# Patient Record
Sex: Female | Born: 1965
Health system: Southern US, Community
[De-identification: ages and names within clinical notes are randomized; demographics above are authoritative.]

## PROBLEM LIST (undated history)

## (undated) DIAGNOSIS — N6452 Nipple discharge: Secondary | ICD-10-CM

## (undated) DIAGNOSIS — K219 Gastro-esophageal reflux disease without esophagitis: Secondary | ICD-10-CM

## (undated) DIAGNOSIS — E079 Disorder of thyroid, unspecified: Secondary | ICD-10-CM

## (undated) HISTORY — DX: Gastro-esophageal reflux disease without esophagitis: K21.9

## (undated) HISTORY — DX: Disorder of thyroid, unspecified: E07.9

---

## 2003-05-30 ENCOUNTER — Other Ambulatory Visit: Admission: RE | Admit: 2003-05-30 | Discharge: 2003-05-30 | Payer: Self-pay | Admitting: Obstetrics and Gynecology

## 2006-02-15 ENCOUNTER — Emergency Department (HOSPITAL_COMMUNITY): Admission: EM | Admit: 2006-02-15 | Discharge: 2006-02-16 | Payer: Self-pay | Admitting: Emergency Medicine

## 2006-09-23 ENCOUNTER — Encounter: Admission: RE | Admit: 2006-09-23 | Discharge: 2006-09-23 | Payer: Self-pay | Admitting: Family Medicine

## 2007-08-21 ENCOUNTER — Encounter: Admission: RE | Admit: 2007-08-21 | Discharge: 2007-08-21 | Payer: Self-pay | Admitting: Obstetrics and Gynecology

## 2008-03-10 ENCOUNTER — Encounter: Admission: RE | Admit: 2008-03-10 | Discharge: 2008-03-10 | Payer: Self-pay | Admitting: Gastroenterology

## 2008-08-23 ENCOUNTER — Encounter: Admission: RE | Admit: 2008-08-23 | Discharge: 2008-08-23 | Payer: Self-pay | Admitting: Obstetrics and Gynecology

## 2009-08-25 ENCOUNTER — Encounter: Admission: RE | Admit: 2009-08-25 | Discharge: 2009-08-25 | Payer: Self-pay | Admitting: Obstetrics and Gynecology

## 2009-09-05 ENCOUNTER — Encounter: Admission: RE | Admit: 2009-09-05 | Discharge: 2009-09-05 | Payer: Self-pay | Admitting: Obstetrics and Gynecology

## 2010-03-18 ENCOUNTER — Encounter: Payer: Self-pay | Admitting: Obstetrics and Gynecology

## 2010-09-05 ENCOUNTER — Other Ambulatory Visit: Payer: Self-pay | Admitting: Obstetrics and Gynecology

## 2010-09-05 DIAGNOSIS — Z1231 Encounter for screening mammogram for malignant neoplasm of breast: Secondary | ICD-10-CM

## 2010-10-04 ENCOUNTER — Ambulatory Visit: Payer: Self-pay

## 2010-10-05 ENCOUNTER — Ambulatory Visit
Admission: RE | Admit: 2010-10-05 | Discharge: 2010-10-05 | Disposition: A | Discharge status: Home / Self Care | Payer: PRIVATE HEALTH INSURANCE | Source: Ambulatory Visit | Attending: Obstetrics and Gynecology | Admitting: Obstetrics and Gynecology

## 2010-10-05 DIAGNOSIS — Z1231 Encounter for screening mammogram for malignant neoplasm of breast: Secondary | ICD-10-CM

## 2011-09-03 ENCOUNTER — Other Ambulatory Visit: Payer: Self-pay | Admitting: Obstetrics and Gynecology

## 2011-09-03 DIAGNOSIS — Z1231 Encounter for screening mammogram for malignant neoplasm of breast: Secondary | ICD-10-CM

## 2011-10-07 ENCOUNTER — Ambulatory Visit
Admission: RE | Admit: 2011-10-07 | Discharge: 2011-10-07 | Disposition: A | Discharge status: Home / Self Care | Payer: No Typology Code available for payment source | Source: Ambulatory Visit | Attending: Obstetrics and Gynecology | Admitting: Obstetrics and Gynecology

## 2011-10-07 DIAGNOSIS — Z1231 Encounter for screening mammogram for malignant neoplasm of breast: Secondary | ICD-10-CM

## 2012-09-07 ENCOUNTER — Other Ambulatory Visit: Payer: Self-pay

## 2012-09-07 DIAGNOSIS — Z1231 Encounter for screening mammogram for malignant neoplasm of breast: Secondary | ICD-10-CM

## 2012-10-08 ENCOUNTER — Ambulatory Visit
Admission: RE | Admit: 2012-10-08 | Discharge: 2012-10-08 | Disposition: A | Discharge status: Home / Self Care | Payer: BC Managed Care – PPO | Source: Ambulatory Visit

## 2012-10-08 DIAGNOSIS — Z1231 Encounter for screening mammogram for malignant neoplasm of breast: Secondary | ICD-10-CM

## 2013-09-03 ENCOUNTER — Other Ambulatory Visit: Payer: Self-pay

## 2013-09-03 DIAGNOSIS — Z1231 Encounter for screening mammogram for malignant neoplasm of breast: Secondary | ICD-10-CM

## 2013-10-11 ENCOUNTER — Ambulatory Visit
Admission: RE | Admit: 2013-10-11 | Discharge: 2013-10-11 | Disposition: A | Discharge status: Home / Self Care | Payer: BC Managed Care – PPO | Source: Ambulatory Visit

## 2013-10-11 ENCOUNTER — Encounter (INDEPENDENT_AMBULATORY_CARE_PROVIDER_SITE_OTHER): Payer: Self-pay

## 2013-10-11 DIAGNOSIS — Z1231 Encounter for screening mammogram for malignant neoplasm of breast: Secondary | ICD-10-CM

## 2014-01-05 ENCOUNTER — Telehealth: Payer: Self-pay | Admitting: Cardiovascular Disease

## 2014-01-05 NOTE — Telephone Encounter (Signed)
Received records from [facility] @ [place] ( Dr Marjory LiesBrent Burnett) for appointment with Dr Tresa EndoKelly on 01/27/14.  [facility] to [facility] Records) for Dr Landry DykeKelly's schedule on 01/27/14.  lp

## 2014-01-07 ENCOUNTER — Telehealth: Payer: Self-pay | Admitting: Cardiovascular Disease

## 2014-01-07 NOTE — Telephone Encounter (Signed)
Spoke to patient.  She states she was calling to see if her appointment can be moved to next week due to the fact she was going to [place] the following week She states she has not had any palp. for few weeks. RN reviewed schedule no available time slot. Patient aware will be placed on waitlist. Patient verbalized understanding. RN notified  Scheduler(Billie)

## 2014-01-07 NOTE — Telephone Encounter (Signed)
Going [place] on [place]24 and is supposed to wear a halter , and wants to know is this something she needs to come in earlier for or can it wait . She will be a new patient seeing Dr. Tresa EndoKelly on 01/27/14.Marland Kitchen. Please Call    Thanks

## 2014-01-27 ENCOUNTER — Ambulatory Visit (INDEPENDENT_AMBULATORY_CARE_PROVIDER_SITE_OTHER): Payer: BC Managed Care – PPO | Admitting: Cardiovascular Disease

## 2014-01-27 ENCOUNTER — Encounter: Payer: Self-pay | Admitting: Cardiovascular Disease

## 2014-01-27 VITALS — BP 142/92 | HR 68 | Ht 69.0 in | Wt 160.1 lb

## 2014-01-27 DIAGNOSIS — I10 Essential (primary) hypertension: Secondary | ICD-10-CM

## 2014-01-27 DIAGNOSIS — R002 Palpitations: Secondary | ICD-10-CM

## 2014-01-27 DIAGNOSIS — F419 Anxiety disorder, unspecified: Secondary | ICD-10-CM

## 2014-01-27 LAB — T3, FREE: T3 FREE: 2.4 pg/mL (ref 2.3–4.2)

## 2014-01-27 LAB — T4, FREE: Free T4: 1.16 ng/dL (ref 0.80–1.80)

## 2014-01-27 LAB — BASIC METABOLIC PANEL WITH GFR
BUN: 9 mg/dL (ref 6–23)
CO2: 28 mEq/L (ref 19–32)
CREATININE: 0.85 mg/dL (ref 0.50–1.10)
Calcium: 9.4 mg/dL (ref 8.4–10.5)
Chloride: 100 mEq/L (ref 96–112)
GFR, EST NON AFRICAN AMERICAN: 82 mL/min
GLUCOSE: 98 mg/dL (ref 70–99)
Potassium: 4.5 mEq/L (ref 3.5–5.3)
SODIUM: 134 meq/L — AB (ref 135–145)

## 2014-01-27 LAB — LIPID PANEL
CHOL/HDL RATIO: 2.5 ratio
Cholesterol: 159 mg/dL (ref 0–200)
HDL: 63 mg/dL (ref >39)
LDL Cholesterol: 60 mg/dL (ref 0–99)
TRIGLYCERIDES: 181 mg/dL — AB (ref <150)
VLDL: 36 mg/dL (ref 0–40)

## 2014-01-27 LAB — MAGNESIUM: Magnesium: 2 mg/dL (ref 1.5–2.5)

## 2014-01-27 MED ORDER — METOPROLOL TARTRATE 25 MG PO TABS: ORAL_TABLET | ORAL | Status: DC

## 2014-01-27 NOTE — Patient Instructions (Signed)
Your physician recommends that you schedule a follow-up appointment in: 4 WEEKS [facility]  Your physician has recommended that you wear an event monitor. Event monitors are medical devices that record the heart's electrical activity. Doctors most often us these monitors to diagnose arrhythmias. Arrhythmias are problems with the speed or rhythm of the heartbeat. The monitor is a small, portable device. You can wear one while you do your normal daily activities. This is usually used to diagnose what is causing palpitations/syncope (passing out). WEAR FOR 2 WEEKS  Your physician recommends that you HAVE LAB WORK TODAY  START METOPROLOL TART 25 MG ONE TABLET TWICE DAILY AS NEEDED FOR PALPITATIONS

## 2014-01-30 ENCOUNTER — Encounter: Payer: Self-pay | Admitting: Cardiovascular Disease

## 2014-01-30 DIAGNOSIS — I10 Essential (primary) hypertension: Secondary | ICD-10-CM | POA: Insufficient documentation

## 2014-01-30 DIAGNOSIS — R002 Palpitations: Secondary | ICD-10-CM | POA: Insufficient documentation

## 2014-01-30 DIAGNOSIS — F419 Anxiety disorder, unspecified: Secondary | ICD-10-CM | POA: Insufficient documentation

## 2014-01-30 NOTE — Progress Notes (Signed)
Patient ID: Traci Lewis, female   DOB: 1965/09/27, 48 y.o.   MRN: [id number]     PATIENT PROFILE: Traci Lewis is a 48 y.o. female who is referred through the courtesy of Dr. Marjory Lies at [facility] practice in [place] for evaluation of heart palpitations and blood pressure elevation.   HPI:  Traci Lewis denies any known coronary artery disease.  She has had mild hypothyroidism for which she has been on levothyroxine at 25 g.  She admits to not being very active.  Previously she had been drinking 2-3 cups of coffee in the morning.  She does exercise utilizing elliptical machine.  She is recently noticed that her heart rate is faster and it does not slow down following exertion.  She was evaluated on 01/03/2014, by her primary physician.  She tells me that her blood pressure was 150/100 and heart rate 1 25 bpm.  An ECGs revealed sinus tachycardia at approximately 103 bpm.  .  Patient states that proximal and 5 or 6 years ago.  She had had episodes of increased heart rate and palpitations.  She has been on citalopram 10 mg daily for anxiety.  She takes when necessary Ativan.  I was able to obtain blood work that she had done on 01/03/2014.  Prefer to level was normal at 47.5, as were iron studies.  Potassium was 4.8.  Renal function was normal with a BUN of 11 and creatinine 0.8.  Folate level was normal at 17.5.  TSH was normal at 2.47.  12 was normal at 505.  She was not anemic and her hemoglobin was 13.9 and hematocrit 43.0.  She presents now for cardiology evaluation.   Past Medical History  Diagnosis Date  . Thyroid condition circa 2013    discovered during pregnancy  . Acid reflux     History reviewed. No pertinent past surgical history.  No Known Allergies  Current Outpatient Prescriptions  Medication Sig Dispense Refill  . citalopram (CELEXA) 10 MG tablet Take 10 mg by mouth daily.  11  . JUNEL FE 1/20 1-20 MG-MCG tablet Take 1 tablet by mouth daily.  10    . levothyroxine (SYNTHROID, LEVOTHROID) 25 MCG tablet Take 1 tablet by mouth daily.  11  . LORazepam (ATIVAN) 0.5 MG tablet Take 0.5 mg by mouth daily as needed.  5  . metoprolol tartrate (LOPRESSOR) 25 MG tablet One tablet twice daily as needed for palpitations 60 tablet 11   No current facility-administered medications for this visit.    Additional social history is notable in that she is married for 23 years.  She has 3 children.  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  She never smoked.  She does drink occasional wine 1-2 days per week.  Family History  Problem Relation Age of Onset  . Arrhythmia Mother   . Heart failure Maternal Grandmother   . Arrhythmia Maternal Grandfather     ROS General: Negative; No fevers, chills, or night sweats HEENT: Negative; No changes in vision or hearing, sinus congestion, difficulty swallowing Pulmonary: Negative; No cough, wheezing, shortness of breath, hemoptysis Cardiovascular:  See HPI; No chest pain, presyncope, syncope,  edema GI: Negative; No nausea, vomiting, diarrhea, or abdominal pain GU: Negative; No dysuria, hematuria, or difficulty voiding Musculoskeletal: Negative; no myalgias, joint pain, or weakness Hematologic/Oncologic: Negative; no easy bruising, bleeding Endocrine: History of mild thyroid abnormality, currently on low-dose Synthroid.  No diabetes Neuro: Negative; no changes in  balance, headaches Skin: Negative; No rashes or skin lesions Psychiatric: Mild anxiety; No behavioral problems, depression Sleep: Negative; No daytime sleepiness, hypersomnolence, bruxism, restless legs, hypnogagnic hallucinations Other comprehensive 14 point system review is negative   Physical Exam BP 142/92 mmHg  Pulse 68  Ht 5\' 9"  (1.753 m)  Wt 160 lb 1.6 oz (72.621 kg)  BMI 23.63 kg/m2  LMP 01/27/2014 (Exact Date) General: Alert, oriented, no distress.  Skin: normal turgor, no rashes, warm and dry HEENT:  Normocephalic, atraumatic. Pupils equal round and reactive to light; sclera anicteric; extraocular muscles intact; Fundi 2 Nose without nasal septal hypertrophy Mouth/Parynx benign; Mallinpatti scale 2 Neck: No JVD, no carotid bruits; normal carotid upstroke Lungs: clear to ausculatation and percussion; no wheezing or rales Chest wall: without tenderness to palpitation Heart: PMI not displaced, RRR, s1 s2 normal, faint 1/6 systolic murmur, no diastolic murmur, no rubs, gallops, thrills, or heaves Abdomen: soft, nontender; no hepatosplenomehaly, BS+; abdominal aorta nontender and not dilated by palpation. Back: no CVA tenderness Pulses 2+ Musculoskeletal: full range of motion, normal strength, no joint deformities Extremities: no clubbing cyanosis or edema, Homan's sign negative  Neurologic: grossly nonfocal; Cranial nerves grossly wnl Psychologic: Normal mood and affect   ECG (independently read by me): Normal sinus rhythm at 68 bpm.  The PR interval is slightly decreased at 102 ms, suggesting mild accelerated AV conduction; there are no delta waves are you against WPW LABS:  BMET    Component Value Date/Time   NA 134* 01/27/2014 1405   K 4.5 01/27/2014 1405   CL 100 01/27/2014 1405   CO2 28 01/27/2014 1405   GLUCOSE 98 01/27/2014 1405   BUN 9 01/27/2014 1405   CREATININE 0.85 01/27/2014 1405   CALCIUM 9.4 01/27/2014 1405   GFRNONAA 82 01/27/2014 1405   GFRAA >89 01/27/2014 1405     Hepatic Function Panel  No results found for: PROT, ALBUMIN, AST, ALT, ALKPHOS, BILITOT, BILIDIR, IBILI   CBC No results found for: WBC, RBC, HGB, HCT, PLT, MCV, MCH, MCHC, RDW, LYMPHSABS, MONOABS, EOSABS, BASOSABS   BNP No results found for: PROBNP  Lipid Panel     Component Value Date/Time   CHOL 159 01/27/2014 1405   TRIG 181* 01/27/2014 1405   HDL 63 01/27/2014 1405   CHOLHDL 2.5 01/27/2014 1405   VLDL 36 01/27/2014 1405   LDLCALC 60 01/27/2014 1405      RADIOLOGY: No  results found.   ASSESSMENT AND PLAN: Traci Lewis is a very pleasant [age] year old female who is originally from [place].  She does not routinely exercise.  She recently has noticed her heart rate staying elevated after she exercises which may be contributed by reduced aerobic capacity.  Her ECG does suggest accelerated AV conduction.  He denies any definitive episodes of SVT or sustained tachycardia.  She was found to be hypertensive recently and blood pressure when taken by the nurse today initially was 142/92.  However, when her blood pressure was rechecked by me.  This was normal at 128/70.  I am recommending laboratory be checked consisting of a comprehensive metabolic panel, lipid studies, also will also obtain a free T3 and free T4 level.  Her recent TSH was normal.  I am scheduling her for a 2-D echo Doppler study to assess systolic and diastolic function as well as valvular architecture.  I am giving her a prescription for metoprolol tartrate to take 25 mg on an as needed basis.  I discussed the importance of increasing her  aerobic capacity and reviewed the beneficial effects of cardiovascular conditioning on her heart rate response to exercise and recovery.  I am scheduling her for 2 week event monitor to assess for potential arrhythmia.  We discussed reduction and avoidance of caffeine. I will see her back in the office in follow-up and further recommendations will be made at that time.   Lennette Biharihomas A. Kelly, MD, [facility] 01/30/2014 1:08 PM

## 2014-02-01 ENCOUNTER — Other Ambulatory Visit: Payer: Self-pay

## 2014-02-01 DIAGNOSIS — R002 Palpitations: Secondary | ICD-10-CM

## 2014-02-01 MED ORDER — METOPROLOL TARTRATE 25 MG PO TABS: ORAL_TABLET | ORAL | Status: DC

## 2014-02-04 ENCOUNTER — Encounter: Payer: Self-pay | Admitting: Cardiovascular Disease

## 2014-02-14 ENCOUNTER — Encounter: Payer: Self-pay | Admitting: Cardiovascular Disease

## 2014-03-08 ENCOUNTER — Ambulatory Visit (INDEPENDENT_AMBULATORY_CARE_PROVIDER_SITE_OTHER): Payer: BC Managed Care – PPO | Admitting: Cardiovascular Disease

## 2014-03-08 ENCOUNTER — Encounter: Payer: Self-pay | Admitting: Cardiovascular Disease

## 2014-03-08 VITALS — BP 100/62 | HR 72 | Ht 68.5 in | Wt 160.9 lb

## 2014-03-08 DIAGNOSIS — R002 Palpitations: Secondary | ICD-10-CM

## 2014-03-08 DIAGNOSIS — I10 Essential (primary) hypertension: Secondary | ICD-10-CM

## 2014-03-08 DIAGNOSIS — F419 Anxiety disorder, unspecified: Secondary | ICD-10-CM

## 2014-03-08 NOTE — Patient Instructions (Signed)
Your physician has requested that you have an echocardiogram. Echocardiography is a painless test that uses sound waves to create images of your heart. It provides your doctor with information about the size and shape of your heart and how well your heart's chambers and valves are working. This procedure takes approximately one hour. There are no restrictions for this procedure.   Your physician wants you to follow-up in:1 year or sooner if needed with Dr. Tresa EndoKelly. You will receive a reminder letter in the mail two months in advance. If you don't receive a letter, please call our office to schedule the follow-up appointment.

## 2014-03-08 NOTE — Progress Notes (Signed)
Patient ID: Traci Lewis, female   DOB: 08/19/1965, 49 y.o.   MRN: [id number]     HPI : Traci Lewis is a 49 y.o. female who was referred through the courtesy of Dr. Marjory Lies at [facility] practice in [place] for evaluation of heart palpitations and blood pressure elevation.  I saw her for initial evaluation one month ago and she presents now for follow-up assessment.   Traci Lewis denies any known coronary artery disease.  She has had mild hypothyroidism for which she has been on levothyroxine at 25 g.  She admits to not being very active.  Previously she had been drinking 2-3 cups of coffee in the morning.  She does exercise utilizing elliptical machine and recently, she noticed that her heart rate and it does not slow down following exertion.  She was evaluated on 01/03/2014, by her primary physician.  An ECGs revealed sinus tachycardia at approximately 103 bpm.  . She had had episodes of increased heart rate and palpitations.  She has been on citalopram 10 mg daily for anxiety.  She takes when necessary Ativan.  Laboratory from 01/03/2014 was reviewed. Potassium was 4.8.  Renal function was normal with a BUN of 11 and creatinine 0.8.  Folate level was normal at 17.5.  TSH was normal at 2.47.  She was not anemic and her hemoglobin was 13.9 and hematocrit 43.0.    When I saw her, I recommended that she wear a 2 week event monitor.  This was reviewed and essentially showed sinus rhythm.  There were very rare episodes of isolated PACs.  Additional laboratory including a magnesium level was normal at 2.0.  She normal free T4 1 0.16 and free T3 at 2.4.  She was supposed undergo a 2-D echo Doppler study, but apparently inadvertently this had never been scheduled.  She denies any recurrent episodes of tachycardia palpitations.  At times she notes mild anxiety spirits him around.  She is anxious to resume exercise.  She denies presyncope or syncope.  She denies chest pain.  She  presents for evaluation.   Past Medical History  Diagnosis Date  . Thyroid condition circa 2013    discovered during pregnancy  . Acid reflux     History reviewed. No pertinent past surgical history.  No Known Allergies  Current Outpatient Prescriptions  Medication Sig Dispense Refill  . citalopram (CELEXA) 10 MG tablet Take 10 mg by mouth daily.  11  . JUNEL FE 1/20 1-20 MG-MCG tablet Take 1 tablet by mouth daily.  10  . levothyroxine (SYNTHROID, LEVOTHROID) 25 MCG tablet Take 1 tablet by mouth daily.  11  . LORazepam (ATIVAN) 0.5 MG tablet Take 0.5 mg by mouth daily as needed.  5  . metoprolol tartrate (LOPRESSOR) 25 MG tablet One tablet twice daily as needed for palpitations 60 tablet 11   No current facility-administered medications for this visit.    Additional social history is notable in that she is married for 23 years.  She has 3 children.  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  She never smoked.  She does drink occasional wine 1-2 days per week.  Family History  Problem Relation Age of Onset  . Arrhythmia Mother   . Heart failure Maternal Grandmother   . Arrhythmia Maternal Grandfather     ROS General: Negative; No fevers, chills, or night sweats HEENT: Negative; No changes in vision or hearing, sinus congestion, difficulty swallowing Pulmonary: Negative;  No cough, wheezing, shortness of breath, hemoptysis Cardiovascular:  See HPI; No chest pain, presyncope, syncope,  edema GI: Negative; No nausea, vomiting, diarrhea, or abdominal pain GU: Negative; No dysuria, hematuria, or difficulty voiding Musculoskeletal: Negative; no myalgias, joint pain, or weakness Hematologic/Oncologic: Negative; no easy bruising, bleeding Endocrine: History of mild thyroid abnormality, currently on low-dose Synthroid.  No diabetes Neuro: Negative; no changes in balance, headaches Skin: Negative; No rashes or skin lesions Psychiatric: Mild anxiety; No  behavioral problems, depression Sleep: Negative; No daytime sleepiness, hypersomnolence, bruxism, restless legs, hypnogagnic hallucinations Other comprehensive 14 point system review is negative   Physical Exam BP 100/62 mmHg  Pulse 72  Ht 5' 8.5" (1.74 m)  Wt 160 lb 14.4 oz (72.984 kg)  BMI 24.11 kg/m2 General: Alert, oriented, no distress.  Skin: normal turgor, no rashes, warm and dry HEENT: Normocephalic, atraumatic. Pupils equal round and reactive to light; sclera anicteric; extraocular muscles intact; Fundi 2 Nose without nasal septal hypertrophy Mouth/Parynx benign; Mallinpatti scale 2 Neck: No JVD, no carotid bruits; normal carotid upstroke Lungs: clear to ausculatation and percussion; no wheezing or rales Chest wall: without tenderness to palpitation; mild pectus escavatum Heart: PMI not displaced, RRR, s1 s2 normal, faint 1/6 systolic murmur, no diastolic murmur, no rubs, gallops, thrills, or heaves Abdomen: soft, nontender; no hepatosplenomehaly, BS+; abdominal aorta nontender and not dilated by palpation. Back: no CVA tenderness Pulses 2+ Musculoskeletal: full range of motion, normal strength, no joint deformities Extremities: no clubbing cyanosis or edema, Homan's sign negative  Neurologic: grossly nonfocal; Cranial nerves grossly wnl Psychologic: Normal mood and affect  ECG (independently read by me): Normal sinus rhythm at 72 bpm.  PR interval is now normal at 120 ms.  QRS 74 ms.  No ectopy.  Normal QTc interval   ECG (independently read by me): Normal sinus rhythm at 68 bpm.  The PR interval is slightly decreased at 102 ms, suggesting mild accelerated AV conduction; there are no delta waves are you against WPW  LABS:  BMET    Component Value Date/Time   NA 134* 01/27/2014 1405   K 4.5 01/27/2014 1405   CL 100 01/27/2014 1405   CO2 28 01/27/2014 1405   GLUCOSE 98 01/27/2014 1405   BUN 9 01/27/2014 1405   CREATININE 0.85 01/27/2014 1405   CALCIUM 9.4  01/27/2014 1405   GFRNONAA 82 01/27/2014 1405   GFRAA >89 01/27/2014 1405     Hepatic Function Panel  No results found for: PROT, ALBUMIN, AST, ALT, ALKPHOS, BILITOT, BILIDIR, IBILI   CBC No results found for: WBC, RBC, HGB, HCT, PLT, MCV, MCH, MCHC, RDW, LYMPHSABS, MONOABS, EOSABS, BASOSABS   BNP No results found for: PROBNP  Lipid Panel     Component Value Date/Time   CHOL 159 01/27/2014 1405   TRIG 181* 01/27/2014 1405   HDL 63 01/27/2014 1405   CHOLHDL 2.5 01/27/2014 1405   VLDL 36 01/27/2014 1405   LDLCALC 60 01/27/2014 1405    RADIOLOGY: No results found.   ASSESSMENT AND PLAN: Ms. Arlyn LeakDebra Duthie is a very pleasant 49 year old female who had not routinely exercised and recently had noticed her heart rate staying elevated after she exercises which may be contributed by reduced aerobic capacity.  Her ECG does suggest accelerated AV conduction.  He denies any definitive episodes of SVT or sustained tachycardia.  When I initially saw her, I gave her prescription for metoprolol, tartrate to take 25-50 mg on an as-needed basis if she did experience an episode of  tachycardia palpitation.  I reviewed her CardioNet monitor with her in detail.  This only showed sinus rhythm with rare PACs.  These occurred approximate 5 AM.  Her subsequent electrolytes were essentially normal.  Her free T4 and free T3 levels are normal and she continues to be on low-dose Synthroid replacement.  She never had a 2-D echo Doppler study.  I have suggested that this be performed to complete her evaluation and make certain there is no structural heart disease.  On her last ECG.  There was a suggestion of mild accelerated AV conduction, without delta waves.  Her ECG today shows a normal PR interval 120 ms. .  I have recommended that she resume exercise.  We discussed the benefits of exercise training with reference to a slower heart rate at any given level left.  Submaximal exercise and with improved recovery  heart rate reduction.  She will continue to avoid caffeine in any type of pseudoephedrine preparations.  I will contact her regarding the echo Doppler study.  If she does note sustained tachycardia.  She will take when necessary metoprolol.  She will contact our office for follow-up evaluation.  Reviewed her lipid studies which were excellent with a total cholesterol 159 LDL of 60, HDL 63 and VLDL 36.  However, triglycerides were elevated.  This was obtained shortly after Thanksgiving.  It may be associated with increased desserts and sugar intake.  Following things given holiday.  We discussed reduction in carbohydrates and sweets.  Otherwise, as long as she remains stable, I will see her in one year for reevaluation.  Time spent: 25 minutes  Lennette Bihari, MD, [facility] 03/08/2014 10:16 AM

## 2014-03-15 ENCOUNTER — Ambulatory Visit (HOSPITAL_COMMUNITY)
Admission: RE | Admit: 2014-03-15 | Discharge: 2014-03-15 | Disposition: A | Discharge status: Home / Self Care | Payer: BC Managed Care – PPO | Source: Ambulatory Visit | Attending: Cardiology | Admitting: Cardiology

## 2014-03-15 DIAGNOSIS — R002 Palpitations: Secondary | ICD-10-CM | POA: Diagnosis present

## 2014-03-15 DIAGNOSIS — I10 Essential (primary) hypertension: Secondary | ICD-10-CM

## 2014-03-15 NOTE — Progress Notes (Signed)
2D Echo Performed 03/15/2014    Laquinton Bihm, RCS  

## 2014-03-20 ENCOUNTER — Encounter: Payer: Self-pay | Admitting: *Deleted

## 2015-07-17 ENCOUNTER — Other Ambulatory Visit: Payer: Self-pay

## 2015-07-17 ENCOUNTER — Encounter (HOSPITAL_COMMUNITY): Payer: Self-pay | Admitting: *Deleted

## 2015-07-17 ENCOUNTER — Emergency Department (HOSPITAL_COMMUNITY): Payer: BLUE CROSS/BLUE SHIELD

## 2015-07-17 DIAGNOSIS — R000 Tachycardia, unspecified: Secondary | ICD-10-CM | POA: Diagnosis present

## 2015-07-17 LAB — BASIC METABOLIC PANEL
ANION GAP: 8 (ref 5–15)
BUN: 15 mg/dL (ref 6–20)
CO2: 24 mmol/L (ref 22–32)
Calcium: 9.2 mg/dL (ref 8.9–10.3)
Chloride: 106 mmol/L (ref 101–111)
Creatinine, Ser: 1.04 mg/dL — ABNORMAL HIGH (ref 0.44–1.00)
GFR calc Af Amer: >60 mL/min (ref >60)
GFR calc non Af Amer: >60 mL/min (ref >60)
GLUCOSE: 107 mg/dL — AB (ref 65–99)
POTASSIUM: 4 mmol/L (ref 3.5–5.1)
Sodium: 138 mmol/L (ref 135–145)

## 2015-07-17 LAB — CBC
HEMATOCRIT: 42.4 % (ref 36.0–46.0)
Hemoglobin: 13.3 g/dL (ref 12.0–15.0)
MCH: 29 pg (ref 26.0–34.0)
MCHC: 31.4 g/dL (ref 30.0–36.0)
MCV: 92.6 fL (ref 78.0–100.0)
PLATELETS: 312 10*3/uL (ref 150–400)
RBC: 4.58 MIL/uL (ref 3.87–5.11)
RDW: 12.5 % (ref 11.5–15.5)
WBC: 6.6 10*3/uL (ref 4.0–10.5)

## 2015-07-17 LAB — TROPONIN I: Troponin I: <0.03 ng/mL (ref <0.031)

## 2015-07-17 NOTE — ED Notes (Signed)
The pt has had tachycardia since dinner time tonight  She has a history of the same  She had a cardiac work up with a holter monitor  She has a rx for lopressor but she doesn not take that regularly.  Some chest heaviness with the tachycardia.  She took ativan 1900  lmp now

## 2015-07-18 ENCOUNTER — Emergency Department (HOSPITAL_COMMUNITY)
Admission: EM | Admit: 2015-07-18 | Discharge: 2015-07-18 | Disposition: A | Discharge status: Home / Self Care | Payer: BLUE CROSS/BLUE SHIELD | Attending: Emergency Medicine | Admitting: Emergency Medicine

## 2015-07-18 NOTE — ED Notes (Signed)
Pt stated that they had kids they needed to get home to and would call to get results in the morning

## 2015-07-19 ENCOUNTER — Encounter: Payer: Self-pay | Admitting: Cardiovascular Disease

## 2015-07-19 ENCOUNTER — Ambulatory Visit (INDEPENDENT_AMBULATORY_CARE_PROVIDER_SITE_OTHER): Payer: BLUE CROSS/BLUE SHIELD | Admitting: Cardiovascular Disease

## 2015-07-19 ENCOUNTER — Other Ambulatory Visit: Payer: Self-pay | Admitting: Cardiovascular Disease

## 2015-07-19 VITALS — BP 112/68 | HR 68 | Ht 68.5 in | Wt 158.8 lb

## 2015-07-19 DIAGNOSIS — F419 Anxiety disorder, unspecified: Secondary | ICD-10-CM

## 2015-07-19 DIAGNOSIS — I10 Essential (primary) hypertension: Secondary | ICD-10-CM | POA: Diagnosis not present

## 2015-07-19 DIAGNOSIS — R9431 Abnormal electrocardiogram [ECG] [EKG]: Secondary | ICD-10-CM

## 2015-07-19 DIAGNOSIS — R002 Palpitations: Secondary | ICD-10-CM

## 2015-07-19 MED ORDER — METOPROLOL SUCCINATE ER 25 MG PO TB24: 25.0000 mg | ORAL_TABLET | Freq: Every day | ORAL | Status: DC

## 2015-07-19 MED ORDER — METOPROLOL TARTRATE 25 MG PO TABS
25.0000 mg | ORAL_TABLET | ORAL | Status: DC | PRN
Start: 2015-07-19 — End: 2015-07-21

## 2015-07-19 NOTE — Patient Instructions (Signed)
Your physician has recommended you make the following change in your medication:   1.) start the new prescription for metoprolol er succ. 25 mg @ 1/2 tablet for the first week. If tolerated then increase to 1 tablet daily.  2.) continue the lopressor as needed. For breakthrough palpitations.  Your physician recommends that you schedule a follow-up appointment in: 3 months.

## 2015-07-20 ENCOUNTER — Other Ambulatory Visit: Payer: Self-pay | Admitting: Cardiovascular Disease

## 2015-07-21 ENCOUNTER — Encounter: Payer: Self-pay | Admitting: Cardiovascular Disease

## 2015-07-21 ENCOUNTER — Telehealth: Payer: Self-pay | Admitting: Cardiovascular Disease

## 2015-07-21 ENCOUNTER — Other Ambulatory Visit: Payer: Self-pay | Admitting: *Deleted

## 2015-07-21 DIAGNOSIS — R9431 Abnormal electrocardiogram [ECG] [EKG]: Secondary | ICD-10-CM | POA: Insufficient documentation

## 2015-07-21 MED ORDER — METOPROLOL TARTRATE 25 MG PO TABS: 25.0000 mg | ORAL_TABLET | ORAL | Status: DC | PRN

## 2015-07-21 NOTE — Progress Notes (Signed)
Patient ID: Traci Lewis, female   DOB: [birth year], 50 y.o.   MRN: [id number]     HPI : Traci Lewis is a 50 y.o. female who was initially referred through the courtesy of Dr. Marjory Lies at [facility] practice in [place] for evaluation of heart palpitations and blood pressure elevation.  She presents now fora 16 month  follow-up assessment after presenting to the emergency room 2 days ago with palpitations.  Traci Lewis denies any known coronary artery disease.  She has  mild hypothyroidism for which she has been on levothyroxine at 25 g.  She admits to not being very active.  Previously she had been drinking 2-3 cups of coffee in the morning.  She does exercise utilizing elliptical machine and recently, last year she began to notice that her heart rate was not slowing down following exertion.  She was evaluated on 01/03/2014, by her primary physician.  An ECGs revealed sinus tachycardia at approximately 103 bpm.  . She had had episodes of increased heart rate and palpitations.  She has been on citalopram 10 mg daily for anxiety and has taken Ativan as needed.  Laboratory from 01/03/2014:  Potassium was 4.8.  Renal function was normal with a BUN of 11 and creatinine 0.8.  Folate level was normal at 17.5.  TSH was normal at 2.47.  She was not anemic and her hemoglobin was 13.9 and hematocrit 43.0.    When I initially saw her, I recommended that she wear a 2 week event monitor.  This essentially showed sinus rhythm.  There were very rare episodes of isolated PACs.  Additional laboratory including a magnesium level was normal at 2.0.  She normal free T4 1 0.16 and free T3 at 2.4.  She was supposed undergo a 2-D echo Doppler study, but apparently inadvertently this had never been scheduled.  Since I last saw her, she had been doing fairly well but admitted to noticing several short-lived episodes of her heart rate speeding up.  I 07/17/2015.  She was sitting at dinner and her  heart began to race.  Her pulse was proximally 123 times a minute.  After 45 minutes.  He never slowed down.  She did not take any metoprolol.  Finally, she presented to the emergency room.  Laboratory was drawn which showed a potassium of 4.0.  Creatinine was 1.04.  Glucose was 107.  She was not anemic.  Initial troponin was negative.  After waiting for approximate 5 hour.  She ultimately had to leave to take care of her children.  She was never formally evaluated by the physician.    Upon further questioning, she denies any syncope.  She denies any episodes of chest pressure.  She denies any pseudoephedrine preparations.  She typically does not drink caffeinated beverages.  However, since she has started to play tennis once a week.  She notices this increased heart rate typically after she exercises and plays tennis and may persist for up to 30 minutes. She presents to the office today for evaluation.  Past Medical History  Diagnosis Date  . Thyroid condition circa 2013    discovered during pregnancy  . Acid reflux     No past surgical history on file.  No Known Allergies  Current Outpatient Prescriptions  Medication Sig Dispense Refill  . BLISOVI 24 FE 1-20 MG-MCG(24) tablet Take 1 tablet by mouth daily.  11  . citalopram (CELEXA) 10 MG tablet Take 10 mg by mouth daily.  11  .  flurbiprofen (ANSAID) 100 MG tablet Take 100 mg by mouth 2 (two) times daily.    Marland Kitchen levothyroxine (SYNTHROID, LEVOTHROID) 25 MCG tablet Take 1 tablet by mouth daily.  11  . LORazepam (ATIVAN) 0.5 MG tablet Take 0.5 mg by mouth daily as needed.  5  . SUMAtriptan (IMITREX) 100 MG tablet Take 100 mg by mouth every 2 (two) hours as needed for migraine. May repeat in 2 hours if headache persists or recurs.    . metoprolol succinate (TOPROL XL) 25 MG 24 hr tablet Take 1 tablet (25 mg total) by mouth daily. 30 tablet 6  . metoprolol tartrate (LOPRESSOR) 25 MG tablet Take 1 tablet (25 mg total) by mouth as needed (for  breakthrough palpitations). 30 tablet 3   No current facility-administered medications for this visit.    Additional social history is notable in that she is married for 23 years.  She has 3 children.  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  She never smoked.  She does drink occasional wine 1-2 days per week.  Family History  Problem Relation Age of Onset  . Arrhythmia Mother   . Heart failure Maternal Grandmother   . Arrhythmia Maternal Grandfather     ROS General: Negative; No fevers, chills, or night sweats HEENT: Negative; No changes in vision or hearing, sinus congestion, difficulty swallowing Pulmonary: Negative; No cough, wheezing, shortness of breath, hemoptysis Cardiovascular:  See HPI;  GI: Negative; No nausea, vomiting, diarrhea, or abdominal pain GU: Negative; No dysuria, hematuria, or difficulty voiding Musculoskeletal: Negative; no myalgias, joint pain, or weakness Hematologic/Oncologic: Negative; no easy bruising, bleeding Endocrine: History of mild thyroid abnormality, currently on low-dose Synthroid.  No diabetes Neuro: Negative; no changes in balance, headaches Skin: Negative; No rashes or skin lesions Psychiatric: Mild anxiety; No behavioral problems, depression Sleep: Negative; No daytime sleepiness, hypersomnolence, bruxism, restless legs, hypnogagnic hallucinations Other comprehensive 14 point system review is negative   Physical Exam BP 112/68 mmHg  Pulse 68  Ht 5' 8.5" (1.74 m)  Wt 158 lb 12.8 oz (72.031 kg)  BMI 23.79 kg/m2  LMP 07/17/2015   Repeat blood pressure by me was 105/64 supine and was 102/64 standing.  Wt Readings from Last 3 Encounters:  07/19/15 158 lb 12.8 oz (72.031 kg)  07/17/15 160 lb (72.576 kg)  03/08/14 160 lb 14.4 oz (72.984 kg)   General: Alert, oriented, no distress.  Skin: normal turgor, no rashes, warm and dry HEENT: Normocephalic, atraumatic. Pupils equal round and reactive to light; sclera  anicteric; extraocular muscles intact; Fundi 2 Nose without nasal septal hypertrophy Mouth/Parynx benign; Mallinpatti scale 2 Neck: No JVD, no carotid bruits; normal carotid upstroke Lungs: clear to ausculatation and percussion; no wheezing or rales Chest wall: without tenderness to palpitation; mild pectus escavatum Heart: PMI not displaced, RRR; there is no ectopy on auscultation, s1 s2 normal, faint 1/6 systolic murmur, no diastolic murmur, no rubs, gallops, thrills, or heaves Abdomen: soft, nontender; no hepatosplenomehaly, BS+; abdominal aorta nontender and not dilated by palpation. Back: no CVA tenderness Pulses 2+ Musculoskeletal: full range of motion, normal strength, no joint deformities Extremities: no clubbing cyanosis or edema, Homan's sign negative  Neurologic: grossly nonfocal; Cranial nerves grossly wnl Psychologic: Normal mood and affect  ECG (independently read by me): Normal sinus rhythm at 68 bpm.  There is accelerated AV conduction with a PR interval at 96 ms.  There are no delta waves.  ECG (independently read by me): Normal sinus rhythm  at 72 bpm.  PR interval is now normal at 120 ms.  QRS 74 ms.  No ectopy.  Normal QTc interval  ECG (independently read by me): Normal sinus rhythm at 68 bpm.  The PR interval is slightly decreased at 102 ms, suggesting mild accelerated AV conduction; there are no delta waves are you against WPW  LABS:  BMP Latest Ref Rng 07/17/2015 01/27/2014  Glucose 65 - 99 mg/dL 161(W) 98  BUN 6 - 20 mg/dL 15 9  Creatinine 9.60 - 1.00 mg/dL 4.54(U) 9.81  Sodium 191 - 145 mmol/L 138 134(L)  Potassium 3.5 - 5.1 mmol/L 4.0 4.5  Chloride 101 - 111 mmol/L 106 100  CO2 22 - 32 mmol/L 24 28  Calcium 8.9 - 10.3 mg/dL 9.2 9.4    No flowsheet data found.  CBC Latest Ref Rng 07/17/2015  WBC 4.0 - 10.5 K/uL 6.6  Hemoglobin 12.0 - 15.0 g/dL 47.8  Hematocrit 29.5 - 46.0 % 42.4  Platelets 150 - 400 K/uL 312    Lab Results  Component Value Date   MCV  92.6 07/17/2015    No results found for: TSH   Lipid Panel     Component Value Date/Time   CHOL 159 01/27/2014 1405   TRIG 181* 01/27/2014 1405   HDL 63 01/27/2014 1405   CHOLHDL 2.5 01/27/2014 1405   VLDL 36 01/27/2014 1405   LDLCALC 60 01/27/2014 1405    RADIOLOGY: Dg Chest 2 View  07/17/2015  CLINICAL DATA:  Chest tightness after "tachy episode" sitting at dinner tonight. EXAM: CHEST  2 VIEW COMPARISON:  02/17/2006 FINDINGS: The cardiomediastinal contours are normal. The lungs are clear. Pulmonary vasculature is normal. No consolidation, pleural effusion, or pneumothorax. No acute osseous abnormalities are seen. IMPRESSION: No acute pulmonary process. Electronically Signed   By: Rubye Oaks M.D.   On: 07/17/2015 21:35     ASSESSMENT AND PLAN: [name] is a very pleasant 50 year old female who had not routinely exercised and In the past had noticed her heart rate staying elevated after she exercises which may be contributed by reduced aerobic capacity.  Her ECG  suggests accelerated AV conduction; there are no delta waves, arguing against WPW.  She denies any definitive episodes of SVT or sustained tachycardia.  When I initially saw her, I gave her prescription for metoprolol tartrate to take 25-50 mg on an as-needed basis if she did experience an episode of tachycardia palpitation.  A prior CardioNet monitor only showed sinus rhythm with rare PACs.  Her most recent episode from 2 days ago occurred at dinner while she was sitting up.  Her blood pressure is on the low side of normal, but she is not orthostatic.  She did not have any caffeine with dinner.  She may very well have had an episode of SVT or sinus tachycardia.  That night.  She recently has noticed her increased heart rate typically after exercise now that she is playing tennis on a weekly basis.  I am electing to start her on low-dose metoprolol succinate at 12.5 mg to take on a daily basis.  Her blood pressure is  low, which may preclude higher dosing.  However, if her blood pressure remained stable she can increase this to 25 mg.  She can also take an extra metoprolol tartrate on an as-needed basis for sustained episode.  I have recommended a follow-up TSH and magnesium level.  Her laboratory from the emergency room was reviewed.  I will see her in  3 months for reevaluation.   Time spent: 25 minutes  Lennette Biharihomas A. Kelly, MD, [facility] 07/21/2015 5:09 PM

## 2015-07-21 NOTE — Telephone Encounter (Signed)
Routing to NL

## 2015-07-21 NOTE — Telephone Encounter (Signed)
New message  Pt c/o medication issue: 1. Name of Medication: metoprolol succinate (TOPROL XL) 25 MG 24 hr tablet and metoprolol tartrate (LOPRESSOR) 25 MG tablet   4. What is your medication issue? Pt called states that she was recently prescribed metoprolol succinate (TOPROL XL) 25 MG 24 hr tablet and she reports that she is a little nervous about taking it and then she states that the other medication metoprolol tartrate (LOPRESSOR) 25 MG tablet  was denied for refills. She would like a call back to clarify why the refill was denied. Please call back to discuss

## 2015-07-21 NOTE — Telephone Encounter (Signed)
Spoke to Traci Lewis Traci Lewis states she was concerned if she should take metoprolol succinate and metoprolol tartrate Traci Lewis does not like take  medications on daily basis. She also states the metropolol tartrate was not called into the pharmacy RN  Informed Traci Lewis that Dr Tresa EndoKELLY would like for her to use metoprolol succinate on a daily basis because it last for 24 hours  and only using the tartrate on an as need basis. Traci Lewis verbalized understanding.  rn contacted pharmacy prescription called

## 2015-07-21 NOTE — Telephone Encounter (Signed)
REFILL 

## 2015-07-21 NOTE — Telephone Encounter (Signed)
Re routing this message  °

## 2015-07-21 NOTE — Telephone Encounter (Signed)
Per vm left by patient she was informed by [facility] that this rx was denied.

## 2015-09-04 ENCOUNTER — Other Ambulatory Visit: Payer: Self-pay | Admitting: Cardiovascular Disease

## 2015-09-04 MED ORDER — METOPROLOL TARTRATE 25 MG PO TABS: 25.0000 mg | ORAL_TABLET | ORAL | Status: DC | PRN

## 2015-09-04 NOTE — Telephone Encounter (Signed)
Rx request sent to pharmacy.  

## 2015-09-05 ENCOUNTER — Other Ambulatory Visit: Payer: Self-pay

## 2016-01-22 ENCOUNTER — Encounter: Payer: Self-pay | Admitting: *Deleted

## 2016-01-23 ENCOUNTER — Ambulatory Visit (INDEPENDENT_AMBULATORY_CARE_PROVIDER_SITE_OTHER): Payer: BLUE CROSS/BLUE SHIELD | Admitting: Cardiovascular Disease

## 2016-01-23 ENCOUNTER — Encounter: Payer: Self-pay | Admitting: Cardiovascular Disease

## 2016-01-23 VITALS — BP 98/70 | HR 68 | Ht 69.0 in | Wt 167.8 lb

## 2016-01-23 DIAGNOSIS — R9431 Abnormal electrocardiogram [ECG] [EKG]: Secondary | ICD-10-CM | POA: Diagnosis not present

## 2016-01-23 DIAGNOSIS — Z8669 Personal history of other diseases of the nervous system and sense organs: Secondary | ICD-10-CM

## 2016-01-23 DIAGNOSIS — I10 Essential (primary) hypertension: Secondary | ICD-10-CM | POA: Diagnosis not present

## 2016-01-23 DIAGNOSIS — Z13220 Encounter for screening for lipoid disorders: Secondary | ICD-10-CM

## 2016-01-23 DIAGNOSIS — R002 Palpitations: Secondary | ICD-10-CM

## 2016-01-23 MED ORDER — METOPROLOL SUCCINATE ER 25 MG PO TB24: 37.5000 mg | ORAL_TABLET | Freq: Every day | ORAL | 6 refills | Status: DC

## 2016-01-23 NOTE — Progress Notes (Signed)
Patient ID: Traci Lewis, female   DOB: 1966/01/21, 50 y.o.   MRN: [id number]     HPI : Traci Lewis is a 50 y.o. female who was initially referred through the courtesy of Dr. Marjory Lies at [facility] practice in [place] for evaluation of heart palpitations and blood pressure elevation.  I last saw her in May 2017.  She presents for follow-up evaluation.  Traci Lewis denies any known coronary artery disease.  She has  mild hypothyroidism for which she has been on levothyroxine at 25 g.  She admits to not being very active.  Previously she had been drinking 2-3 cups of coffee in the morning.  She does exercise utilizing elliptical machine and recently, last year she began to notice that her heart rate was not slowing down following exertion.  She was evaluated on 01/03/2014, by her primary physician.  An ECGs revealed sinus tachycardia at approximately 103 bpm.  . She had had episodes of increased heart rate and palpitations.  She has been on citalopram 10 mg daily for anxiety and has taken Ativan as needed.  Laboratory from 01/03/2014:  Potassium was 4.8.  Renal function was normal with a BUN of 11 and creatinine 0.8.  Folate level was normal at 17.5.  TSH was normal at 2.47.  She was not anemic and her hemoglobin was 13.9 and hematocrit 43.0.    When I initially saw her, I recommended that she wear a 2 week event monitor.  This essentially showed sinus rhythm.  There were very rare episodes of isolated PACs.  Additional laboratory including a magnesium level was normal at 2.0.  She normal free T4 1 0.16 and free T3 at 2.4.  She was supposed undergo a 2-D echo Doppler study, but apparently inadvertently this had never been scheduled.  Since I last saw her, she had been doing fairly well but admitted to noticing several short-lived episodes of her heart rate speeding up.  I 07/17/2015.  She was sitting at dinner and her heart began to race.  Her pulse was proximally 123 times a  minute.  After 45 minutes.  He never slowed down.  She did not take any metoprolol.  Finally, she presented to the emergency room.  Laboratory was drawn which showed a potassium of 4.0.  Creatinine was 1.04.  Glucose was 107.  She was not anemic.  Initial troponin was negative.  After waiting for approximate 5 hour.  She ultimately had to leave to take care of her children.  She was never formally evaluated by the physician.    When I last saw her, she complained of persistent tachycardia typically after she exercised was playing tennis.  At that time, I instituted Toprol-XL 25 mg to take on a daily basis and she still had metoprolol, tartrate to take on a when necessary basis.  This has helped.  However, she has continued to experience some of these episodes.  She admits that she does not routinely do aerobic exercise.  She has a history of migraine headaches and has seen Dr. Mechele Collin ligament at the headache[facility].  She has a prescription for Imitrex which she takes on an as-needed basis.  She also is currently on levothyroxine 25 g.  She takes Celexa 10 mg daily.  She was recently given.  He told of all for headache relief as an anti-inflammatory agent.  She presents for follow-up cardiology evaluation.    Past Medical History:  Diagnosis Date  .  Acid reflux   . Thyroid condition circa 2013   discovered during pregnancy    No past surgical history on file.  No Known Allergies  Current Outpatient Prescriptions  Medication Sig Dispense Refill  . BLISOVI 24 FE 1-20 MG-MCG(24) tablet Take 1 tablet by mouth daily.  11  . citalopram (CELEXA) 10 MG tablet Take 10 mg by mouth daily.  11  . etodolac (LODINE) 500 MG tablet Take 1 tablet by mouth as directed.  0  . levothyroxine (SYNTHROID, LEVOTHROID) 25 MCG tablet Take 1 tablet by mouth daily.  11  . LORazepam (ATIVAN) 0.5 MG tablet Take 0.5 mg by mouth daily as needed.  5  . metoprolol succinate (TOPROL XL) 25 MG 24 hr tablet Take 1.5  tablets (37.5 mg total) by mouth daily. 45 tablet 6  . metoprolol tartrate (LOPRESSOR) 25 MG tablet Take 1 tablet (25 mg total) by mouth as needed (for breakthrough palpitations). 90 tablet 3  . SUMAtriptan (IMITREX) 100 MG tablet Take 100 mg by mouth every 2 (two) hours as needed for migraine. May repeat in 2 hours if headache persists or recurs.     No current facility-administered medications for this visit.     Additional social history is notable in that she is married for 23 years.  She has 3 children.  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  She never smoked.  She does drink occasional wine 1-2 days per week.  Family History  Problem Relation Age of Onset  . Arrhythmia Mother   . Heart failure Maternal Grandmother   . Arrhythmia Maternal Grandfather     ROS General: Negative; No fevers, chills, or night sweats HEENT: Negative; No changes in vision or hearing, sinus congestion, difficulty swallowing Pulmonary: Negative; No cough, wheezing, shortness of breath, hemoptysis Cardiovascular:  See HPI;  GI: Negative; No nausea, vomiting, diarrhea, or abdominal pain GU: Negative; No dysuria, hematuria, or difficulty voiding Musculoskeletal: Negative; no myalgias, joint pain, or weakness Hematologic/Oncologic: Negative; no easy bruising, bleeding Endocrine: History of mild thyroid abnormality, currently on low-dose Synthroid.  No diabetes Neuro: Negative; no changes in balance, headaches Skin: Negative; No rashes or skin lesions Psychiatric: Mild anxiety; No behavioral problems, depression Sleep: Negative; No daytime sleepiness, hypersomnolence, bruxism, restless legs, hypnogagnic hallucinations Other comprehensive 14 point system review is negative   Physical Exam BP 98/70   Pulse 68   Ht 5\' 9"  (1.753 m)   Wt 167 lb 12.8 oz (76.1 kg)   BMI 24.78 kg/m    Repeat blood pressure by me was 105/64 supine and was 102/64 standing.  Wt Readings from Last  3 Encounters:  01/23/16 167 lb 12.8 oz (76.1 kg)  07/19/15 158 lb 12.8 oz (72 kg)  07/17/15 160 lb (72.6 kg)   General: Alert, oriented, no distress.  Skin: normal turgor, no rashes, warm and dry HEENT: Normocephalic, atraumatic. Pupils equal round and reactive to light; sclera anicteric; extraocular muscles intact; Fundi 2 Nose without nasal septal hypertrophy Mouth/Parynx benign; Mallinpatti scale 2 Neck: No JVD, no carotid bruits; normal carotid upstroke Lungs: clear to ausculatation and percussion; no wheezing or rales Chest wall: without tenderness to palpitation; mild pectus escavatum Heart: PMI not displaced, RRR; there is no ectopy on auscultation, s1 s2 normal, faint 1/6 systolic murmur, no diastolic murmur, no rubs, gallops, thrills, or heaves Abdomen: soft, nontender; no hepatosplenomehaly, BS+; abdominal aorta nontender and not dilated by palpation. Back: no CVA tenderness Pulses 2+ Musculoskeletal: full range  of motion, normal strength, no joint deformities Extremities: no clubbing cyanosis or edema, Homan's sign negative  Neurologic: grossly nonfocal; Cranial nerves grossly wnl Psychologic: Normal mood and affect  ECG (independently read by me): Sinus rhythm.  Short PR interval at 106 ms.  QTc interval normal.  May 2017 ECG (independently read by me): Normal sinus rhythm at 68 bpm.  There is accelerated AV conduction with a PR interval at 96 ms.  There are no delta waves.  ECG (independently read by me): Normal sinus rhythm at 72 bpm.  PR interval is now normal at 120 ms.  QRS 74 ms.  No ectopy.  Normal QTc interval  ECG (independently read by me): Normal sinus rhythm at 68 bpm.  The PR interval is slightly decreased at 102 ms, suggesting mild accelerated AV conduction; there are no delta waves are you against WPW  LABS:  BMP Latest Ref Rng & Units 07/17/2015 01/27/2014  Glucose 65 - 99 mg/dL 536(U107(H) 98  BUN 6 - 20 mg/dL 15 9  Creatinine 4.400.44 - 1.00 mg/dL 3.47(Q1.04(H) 2.590.85    Sodium 135 - 145 mmol/L 138 134(L)  Potassium 3.5 - 5.1 mmol/L 4.0 4.5  Chloride 101 - 111 mmol/L 106 100  CO2 22 - 32 mmol/L 24 28  Calcium 8.9 - 10.3 mg/dL 9.2 9.4    No flowsheet data found.  CBC Latest Ref Rng & Units 07/17/2015  WBC 4.0 - 10.5 K/uL 6.6  Hemoglobin 12.0 - 15.0 g/dL 56.313.3  Hematocrit 87.536.0 - 46.0 % 42.4  Platelets 150 - 400 K/uL 312    Lab Results  Component Value Date   MCV 92.6 07/17/2015    No results found for: TSH   Lipid Panel     Component Value Date/Time   CHOL 159 01/27/2014 1405   TRIG 181 (H) 01/27/2014 1405   HDL 63 01/27/2014 1405   CHOLHDL 2.5 01/27/2014 1405   VLDL 36 01/27/2014 1405   LDLCALC 60 01/27/2014 1405    RADIOLOGY: No results found.   ASSESSMENT AND PLAN: Ms. Arlyn LeakDebra Mcgurn is a very pleasant 50 year old female who had not routinely exercised and In the past had noticed her heart rate staying elevated after she exercised which may be contributed by reduced aerobic capacity.  Her ECG  suggests accelerated AV conduction; there are no delta waves, arguing against WPW.  She denies any definitive episodes of SVT or sustained tachycardia.  When I initially saw her, I gave her prescription for metoprolol tartrate to take 25-50 mg on an as-needed basis if she did experience an episode of tachycardia palpitation.  A prior CardioNet monitor only showed sinus rhythm with rare PACs.  When I last saw her, due to continued persistent elevated heart rates after exercise I started her on metoprolol succinate initially at 12.5 mg with titration up to 25 mg.  This has improved her symptoms.  However, she still notes that at times if she has to run fast heart rate seems to abruptly increase.  She denies associated presyncope or syncope.  She denies any sensation of chest pressure.  I have suggested further titration of her metoprolol succinate to 37.5 mg daily.  She had noted some fatigue since initiating treatment and for this reason have not titrated  her to 50 mg but discuss with her if recurrent symptomatology occurs this may be necessary.  I again specifically went over the heart rate response associated with SVT versus sinus tachycardia.  She denies any difficulty with sleep.  She  denies any daytime sleepiness.  She denies nocturnal palpitations.  We discussed avoidance of caffeine, which she has still been taking.  I am recommending a complete set of fasting laboratory including thyroid function studies, a magnesium level.  Remotely, her triglycerides were elevated, and lipid studies will be done.  I encouraged her to at least to some type of aerobic activity 4-5 days per week to improve aerobic capacity.  She will contact our office if she developed develops recurrent episodes of tachycardia.  If she remains stable I will see her in 6 months for follow-up evaluation.  Time spent: 25 minutes  Lennette Biharihomas A. Lamerle Jabs, MD, [facility] 01/23/2016 12:44 PM

## 2016-01-23 NOTE — Patient Instructions (Signed)
Your physician has recommended you make the following change in your medication:   1.) the metoprolol succ has been increased to 37.5 mg daily (1.5 of the 25 mg tablet.)  Your physician recommends that you return for lab work in: FASTING  Your physician wants you to follow-up in: 6 months or sooner if needed. You will receive a reminder letter in the mail two months in advance. If you don't receive a letter, please call our office to schedule the follow-up appointment.  If you need a refill on your cardiac medications before your next appointment, please call your pharmacy.

## 2016-05-21 LAB — CBC
HEMATOCRIT: 40 % (ref 35.0–45.0)
Hemoglobin: 13.1 g/dL (ref 11.7–15.5)
MCH: 30 pg (ref 27.0–33.0)
MCHC: 32.8 g/dL (ref 32.0–36.0)
MCV: 91.7 fL (ref 80.0–100.0)
MPV: 10.2 fL (ref 7.5–12.5)
PLATELETS: 276 10*3/uL (ref 140–400)
RBC: 4.36 MIL/uL (ref 3.80–5.10)
RDW: 12.8 % (ref 11.0–15.0)
WBC: 5.9 10*3/uL (ref 3.8–10.8)

## 2016-05-21 LAB — COMPREHENSIVE METABOLIC PANEL
ALT: 11 U/L (ref 6–29)
AST: 13 U/L (ref 10–35)
Albumin: 3.8 g/dL (ref 3.6–5.1)
Alkaline Phosphatase: 26 U/L — ABNORMAL LOW (ref 33–115)
BUN: 17 mg/dL (ref 7–25)
CALCIUM: 8.8 mg/dL (ref 8.6–10.2)
CO2: 27 mmol/L (ref 20–31)
Chloride: 104 mmol/L (ref 98–110)
Creat: 0.96 mg/dL (ref 0.50–1.10)
GLUCOSE: 83 mg/dL (ref 65–99)
POTASSIUM: 4.5 mmol/L (ref 3.5–5.3)
Sodium: 137 mmol/L (ref 135–146)
Total Bilirubin: 0.6 mg/dL (ref 0.2–1.2)
Total Protein: 6.8 g/dL (ref 6.1–8.1)

## 2016-05-21 LAB — LIPID PANEL
CHOL/HDL RATIO: 3.2 ratio (ref <5.0)
Cholesterol: 169 mg/dL (ref <200)
HDL: 53 mg/dL (ref >50)
LDL CALC: 86 mg/dL (ref <100)
TRIGLYCERIDES: 149 mg/dL (ref <150)
VLDL: 30 mg/dL (ref <30)

## 2016-05-21 LAB — MAGNESIUM: Magnesium: 2.1 mg/dL (ref 1.5–2.5)

## 2016-05-21 LAB — TSH: TSH: 3.87 mIU/L

## 2016-06-03 ENCOUNTER — Other Ambulatory Visit: Payer: Self-pay | Admitting: Family Medicine

## 2016-06-03 DIAGNOSIS — R1012 Left upper quadrant pain: Secondary | ICD-10-CM

## 2016-06-05 ENCOUNTER — Ambulatory Visit
Admission: RE | Admit: 2016-06-05 | Discharge: 2016-06-05 | Disposition: A | Discharge status: Home / Self Care | Payer: BLUE CROSS/BLUE SHIELD | Source: Ambulatory Visit | Attending: Family Medicine | Admitting: Family Medicine

## 2016-06-05 ENCOUNTER — Other Ambulatory Visit: Payer: BLUE CROSS/BLUE SHIELD

## 2016-06-05 DIAGNOSIS — R1012 Left upper quadrant pain: Secondary | ICD-10-CM

## 2016-07-04 ENCOUNTER — Other Ambulatory Visit: Payer: Self-pay | Admitting: *Deleted

## 2016-07-04 MED ORDER — METOPROLOL SUCCINATE ER 25 MG PO TB24: 37.5000 mg | ORAL_TABLET | Freq: Every day | ORAL | Status: DC

## 2016-07-04 MED ORDER — METOPROLOL TARTRATE 25 MG PO TABS: 25.0000 mg | ORAL_TABLET | ORAL | 3 refills | Status: DC | PRN

## 2016-09-04 ENCOUNTER — Other Ambulatory Visit: Payer: Self-pay | Admitting: Cardiovascular Disease

## 2017-01-22 ENCOUNTER — Other Ambulatory Visit: Payer: Self-pay | Admitting: *Deleted

## 2017-01-22 MED ORDER — METOPROLOL SUCCINATE ER 25 MG PO TB24: 37.5000 mg | ORAL_TABLET | Freq: Every day | ORAL | 0 refills | Status: DC

## 2017-03-04 ENCOUNTER — Other Ambulatory Visit: Payer: Self-pay | Admitting: Obstetrics and Gynecology

## 2017-03-04 DIAGNOSIS — N6452 Nipple discharge: Secondary | ICD-10-CM

## 2017-03-07 ENCOUNTER — Other Ambulatory Visit: Payer: BLUE CROSS/BLUE SHIELD

## 2017-03-14 ENCOUNTER — Ambulatory Visit
Admission: RE | Admit: 2017-03-14 | Discharge: 2017-03-14 | Disposition: A | Discharge status: Home / Self Care | Payer: BLUE CROSS/BLUE SHIELD | Source: Ambulatory Visit | Attending: Obstetrics and Gynecology | Admitting: Obstetrics and Gynecology

## 2017-03-14 DIAGNOSIS — N6452 Nipple discharge: Secondary | ICD-10-CM

## 2017-03-14 HISTORY — DX: Nipple discharge: N64.52

## 2017-03-19 ENCOUNTER — Other Ambulatory Visit: Payer: Self-pay | Admitting: Surgery

## 2017-03-19 DIAGNOSIS — N6452 Nipple discharge: Secondary | ICD-10-CM

## 2017-04-01 ENCOUNTER — Ambulatory Visit
Admission: RE | Admit: 2017-04-01 | Discharge: 2017-04-01 | Disposition: A | Discharge status: Home / Self Care | Payer: BLUE CROSS/BLUE SHIELD | Source: Ambulatory Visit | Attending: Surgery | Admitting: Surgery

## 2017-04-01 DIAGNOSIS — N6452 Nipple discharge: Secondary | ICD-10-CM

## 2017-04-01 MED ORDER — GADOBENATE DIMEGLUMINE 529 MG/ML IV SOLN
13.0000 mL | Freq: Once | INTRAVENOUS | Status: AC | PRN
  Administered 2017-04-01: 13 mL via INTRAVENOUS

## 2017-04-08 ENCOUNTER — Other Ambulatory Visit: Payer: Self-pay | Admitting: Surgery

## 2017-04-08 DIAGNOSIS — N6452 Nipple discharge: Secondary | ICD-10-CM

## 2017-04-15 ENCOUNTER — Ambulatory Visit
Admission: RE | Admit: 2017-04-15 | Discharge: 2017-04-15 | Disposition: A | Discharge status: Home / Self Care | Payer: BLUE CROSS/BLUE SHIELD | Source: Ambulatory Visit | Attending: Surgery | Admitting: Surgery

## 2017-04-15 ENCOUNTER — Other Ambulatory Visit: Payer: Self-pay | Admitting: Surgery

## 2017-04-15 DIAGNOSIS — N6452 Nipple discharge: Secondary | ICD-10-CM

## 2017-04-15 MED ORDER — GADOBENATE DIMEGLUMINE 529 MG/ML IV SOLN: 15.0000 mL | Freq: Once | INTRAVENOUS | Status: DC | PRN

## 2017-04-17 ENCOUNTER — Other Ambulatory Visit: Payer: Self-pay | Admitting: Surgery

## 2017-04-18 ENCOUNTER — Other Ambulatory Visit: Payer: Self-pay | Admitting: Cardiovascular Disease

## 2017-04-18 NOTE — Telephone Encounter (Signed)
REFILL 

## 2017-05-15 ENCOUNTER — Other Ambulatory Visit: Payer: Self-pay | Admitting: Cardiovascular Disease

## 2017-05-19 ENCOUNTER — Other Ambulatory Visit: Payer: Self-pay

## 2017-05-19 MED ORDER — LORAZEPAM 0.5 MG PO TABS: 0.5000 mg | ORAL_TABLET | Freq: Every day | ORAL | 5 refills | Status: AC | PRN

## 2017-05-21 ENCOUNTER — Other Ambulatory Visit: Payer: Self-pay | Admitting: *Deleted

## 2017-06-04 ENCOUNTER — Other Ambulatory Visit: Payer: Self-pay | Admitting: Cardiovascular Disease

## 2017-06-04 MED ORDER — METOPROLOL SUCCINATE ER 25 MG PO TB24: 37.5000 mg | ORAL_TABLET | Freq: Every day | ORAL | 0 refills | Status: DC

## 2017-06-04 NOTE — Telephone Encounter (Signed)
°*  STAT* If patient is at the pharmacy, call can be transferred to refill team.   1. Which medications need to be refilled? (please list name of each medication and dose if known) Metoprolol  2. Which pharmacy/location (including street and city if local pharmacy) is medication to be sent to?[facility] [phone] 3. Do they need a 30 day or 90 day supply? 135 and refills

## 2017-07-21 NOTE — Progress Notes (Signed)
Cardiology Office Note   Date:  07/22/2017   ID:  Traci Lewis, DOB 11-16-1965, MRN [id number]   PCP:  Richmond Campbell., PA-C  Cardiologist:  Dr. Tresa Endo  Chief Complaint  Patient presents with  . Follow-up  . Palpitations     History of Present Illness: Traci Lewis is a 52 y.o. female who presents for ongoing assessment and management of coronary artery disease, with history of tachycardia with frequent PACs, history of hypothyroidism.  The patient was placed on metoprolol XL 25 mg to take on a daily basis and additional metoprolol tartrate for breakthrough tachycardia which mostly occurs after exercise such as playing tennis.  The patient was last seen in the office on 01/23/2016 by Dr. Tresa Endo and was to follow-up with him in 6 months unless symptoms persisted or worsened.   She continues to have rare episodes of tachycardia. Most recent when she was outside in the heat working in her garden. She is here for refills. She has not had any labs completed in over a year concerning BMET, CBC or Thyroid studies.   Past Medical History:  Diagnosis Date  . Acid reflux   . Breast discharge    left clear/yellowish spont  . Thyroid condition circa 2013   discovered during pregnancy    No past surgical history on file.   Current Outpatient Medications  Medication Sig Dispense Refill  . BLISOVI 24 FE 1-20 MG-MCG(24) tablet Take 1 tablet by mouth daily.  11  . citalopram (CELEXA) 10 MG tablet Take 10 mg by mouth daily.  11  . etodolac (LODINE) 500 MG tablet Take 1 tablet by mouth as directed.  0  . flurbiprofen (ANSAID) 100 MG tablet Take 100 mg by mouth 2 (two) times daily as needed.    Marland Kitchen levothyroxine (SYNTHROID, LEVOTHROID) 25 MCG tablet Take 1 tablet by mouth daily.  11  . LORazepam (ATIVAN) 0.5 MG tablet Take 1 tablet (0.5 mg total) by mouth daily as needed. 30 tablet 5  . metoprolol succinate (TOPROL-XL) 25 MG 24 hr tablet Take 1.5 tablets (37.5 mg total) by mouth daily. 135  tablet 3  . metoprolol tartrate (LOPRESSOR) 25 MG tablet Take 1 tablet (25 mg total) by mouth as needed (for breakthrough palpitations). 90 tablet 3  . SUMAtriptan (IMITREX) 100 MG tablet Take 100 mg by mouth every 2 (two) hours as needed for migraine. May repeat in 2 hours if headache persists or recurs.     No current facility-administered medications for this visit.     Allergies:   Patient has no known allergies.  Social History:  The patient  reports that she has never smoked. She has never used smokeless tobacco. She reports that she drinks about 0.6 oz of alcohol per week. She reports that she does not use drugs.   Family History:  The patient's family history includes Arrhythmia in her maternal grandfather and mother; Breast cancer in her maternal aunt and sister; Heart failure in her maternal grandmother.    ROS: All other systems are reviewed and negative. Unless otherwise mentioned in H&P    PHYSICAL EXAM: VS:  BP 112/68   Pulse 66   Ht  (1.753 m)   Wt 167 lb 9.6 oz (76 kg)   BMI 24.75 kg/m  , BMI Body mass index is 24.75 kg/m. GEN: Well nourished, well developed, in no acute distress  HEENT: normal  Neck: no JVD, carotid bruits, or masses Cardiac: RRR; no murmurs, rubs, or gallops,no  edema  Respiratory:  clear to auscultation bilaterally, normal work of breathing GI: soft, nontender, nondistended, + BS MS: no deformity or atrophy  Skin: warm and dry, no rash Neuro:  Strength and sensation are intact Psych: euthymic mood, full affect   EKG:  NSR with short PR interval. Rate of 66 bpm/.   Recent Labs: No results found for requested labs within last 8760 hours.    Lipid Panel    Component Value Date/Time   CHOL 169 05/21/2016 0809   TRIG 149 05/21/2016 0809   HDL 53 05/21/2016 0809   CHOLHDL 3.2 05/21/2016 0809   VLDL 30 05/21/2016 0809   LDLCALC 86 05/21/2016 0809      Wt Readings from Last 3 Encounters:  07/22/17 167 lb 9.6 oz (76 kg)  01/23/16  167 lb 12.8 oz (76.1 kg)  07/19/15 158 lb 12.8 oz (72 kg)      Other studies Reviewed:  Echocardiogram 03/15/2014  Left ventricle: The cavity size was normal. Wall thickness was normal. Systolic function was normal. The estimated ejection fraction was in the range of 55% to 60%. Wall motion was normal; there were no regional wall motion abnormalities. Left ventricular diastolic function parameters were normal. - Mitral valve: Trivial prolapse, involving the anterior leaflet.  ASSESSMENT AND PLAN:  1. Tachy-palpitations: She is having rare episodes of racing heart rate Most recent a few days ago when she was working outside in her garden. She drank some water and rested, heart rate returned to normal.  I will refill metoprolol succinate and tartrate. She will have labs completed to include magnesium, TSH, CBC and BMET.  2. Hypothyroidism: She has not seen PCP in a while. I have advised her to follow up and discuss PCP needs and refills on thyroid medications. Labs will be forwarded to her.    Current medicines are reviewed at length with the patient today.    Labs/ tests ordered today include: Mg, CBC, BMET, TSH   Bettey Mare. Liborio Nixon, ANP, AACC   07/22/2017 11:11 AM    [facility] [address], [place], [place] [address] Phone: [phone]; Fax: [phone]

## 2017-07-22 ENCOUNTER — Encounter: Payer: Self-pay | Admitting: Adult Health

## 2017-07-22 ENCOUNTER — Ambulatory Visit: Payer: BLUE CROSS/BLUE SHIELD | Admitting: Adult Health

## 2017-07-22 VITALS — BP 112/68 | HR 66 | Ht 69.0 in | Wt 167.6 lb

## 2017-07-22 DIAGNOSIS — I10 Essential (primary) hypertension: Secondary | ICD-10-CM

## 2017-07-22 DIAGNOSIS — R002 Palpitations: Secondary | ICD-10-CM | POA: Diagnosis not present

## 2017-07-22 DIAGNOSIS — Z79899 Other long term (current) drug therapy: Secondary | ICD-10-CM

## 2017-07-22 LAB — BASIC METABOLIC PANEL
BUN/Creatinine Ratio: 16 (ref 9–23)
BUN: 13 mg/dL (ref 6–24)
CO2: 19 mmol/L — ABNORMAL LOW (ref 20–29)
CREATININE: 0.83 mg/dL (ref 0.57–1.00)
Calcium: 9.3 mg/dL (ref 8.7–10.2)
Chloride: 104 mmol/L (ref 96–106)
GFR calc Af Amer: 95 mL/min/{1.73_m2} (ref >59)
GFR, EST NON AFRICAN AMERICAN: 82 mL/min/{1.73_m2} (ref >59)
GLUCOSE: 77 mg/dL (ref 65–99)
Potassium: 4.9 mmol/L (ref 3.5–5.2)
SODIUM: 137 mmol/L (ref 134–144)

## 2017-07-22 LAB — TSH: TSH: 2.79 u[IU]/mL (ref 0.450–4.500)

## 2017-07-22 LAB — MAGNESIUM: MAGNESIUM: 2 mg/dL (ref 1.6–2.3)

## 2017-07-22 LAB — CBC
HEMATOCRIT: 39.3 % (ref 34.0–46.6)
HEMOGLOBIN: 13.2 g/dL (ref 11.1–15.9)
MCH: 30.6 pg (ref 26.6–33.0)
MCHC: 33.6 g/dL (ref 31.5–35.7)
MCV: 91 fL (ref 79–97)
Platelets: 311 10*3/uL (ref 150–450)
RBC: 4.31 x10E6/uL (ref 3.77–5.28)
RDW: 12.5 % (ref 12.3–15.4)
WBC: 6.6 10*3/uL (ref 3.4–10.8)

## 2017-07-22 MED ORDER — METOPROLOL TARTRATE 25 MG PO TABS: 25.0000 mg | ORAL_TABLET | ORAL | 3 refills | Status: DC | PRN

## 2017-07-22 MED ORDER — METOPROLOL SUCCINATE ER 25 MG PO TB24: 37.5000 mg | ORAL_TABLET | Freq: Every day | ORAL | 3 refills | Status: DC

## 2017-07-22 NOTE — Patient Instructions (Signed)
Medication Instructions:  NO CHANGES- Your physician recommends that you continue on your current medications as directed. Please refer to the Current Medication list given to you today.  If you need a refill on your cardiac medications before your next appointment, please call your pharmacy.  Special Instructions: PLEASE STAY HYDRATED IN THIS HOT WEATHER  WE WILL CALL YOU WITH LAB RESULTS, PLEASE CALL WITH ANY ISSUES  Follow-Up: Your physician wants you to follow-up in: 12 MONTHS WITH DR Tresa Endo You should receive a reminder letter in the mail two months in advance. If you do not receive a letter, please call our office MARCH 2020 to schedule the MAY 2020 follow-up appointment.   Thank you for choosing [facility] at [facility]!!

## 2017-11-22 ENCOUNTER — Telehealth: Payer: Self-pay | Admitting: Cardiology

## 2017-11-22 NOTE — Telephone Encounter (Signed)
Pt called after hours number with complaints of fast heart beat when she woke this am at 6:30, she didn't count rate but felt her neck and was fast. She went back to sleep and was still fast when she woke at 8:30 so she took a prn metoprolol tartrate 25 mg. She called because her heart rate was in the low 100's. She felt weak, slightly dizzy when her HR was up. I advised her to go to the ED and she really doesn't want to go. While I talked to her I had her check her BP, 104/81, which she says is her norm. At the time HR is 80 on the BP monitor and also on her FitBit. In discussion she will take it easy today and can take another dose of metoprolol if HR goes back up. Also talked her through valsalva maneuver. I advised her that if her HR goes up again and she cannot control it, or if she is symptomatic she needs to go to the ED.   Berton Bon, AGNP-C [facility] 11/22/2017  9:59 AM

## 2017-11-24 NOTE — Telephone Encounter (Signed)
Agree with recommendations.  

## 2018-03-14 IMAGING — US US ABDOMEN COMPLETE
1 series · 14 of 25 positions shown · non-contrast
Comparison: None in PACs

CLINICAL DATA: Left upper quadrant abdominal pain for the past
month

EXAM:
ABDOMEN ULTRASOUND COMPLETE

[Series 1: us abdomen complete · 0.17mm/px · 14 of 85 slices shown]
[im 1/85]
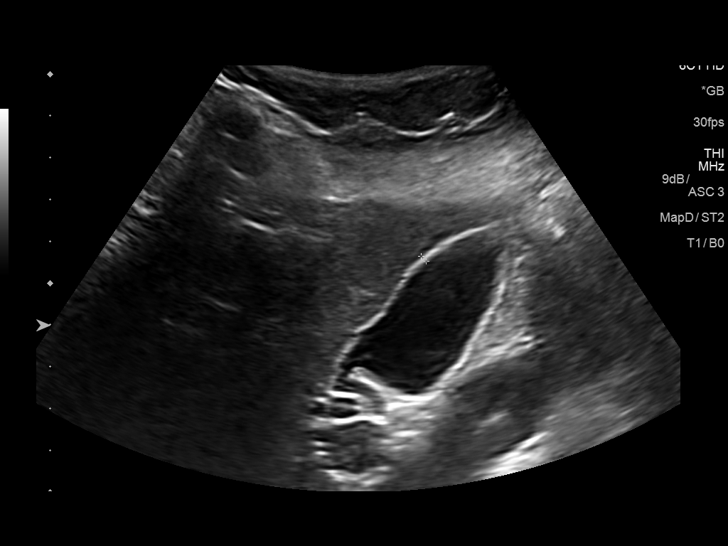
[im 8/85]
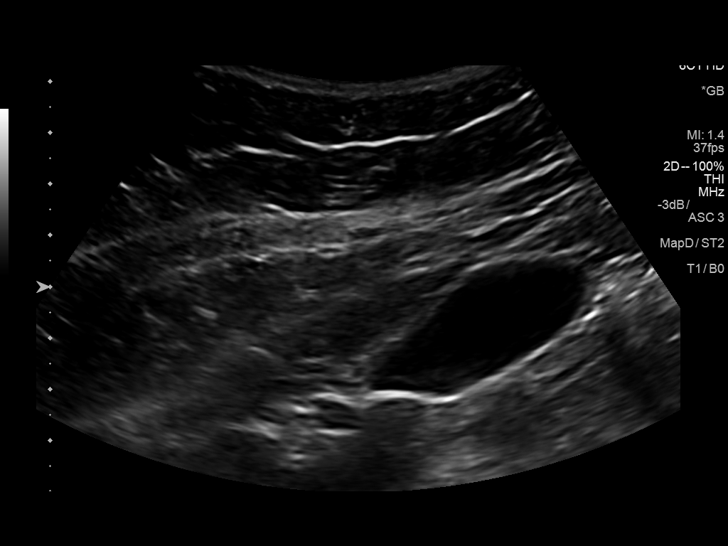
[im 15/85]
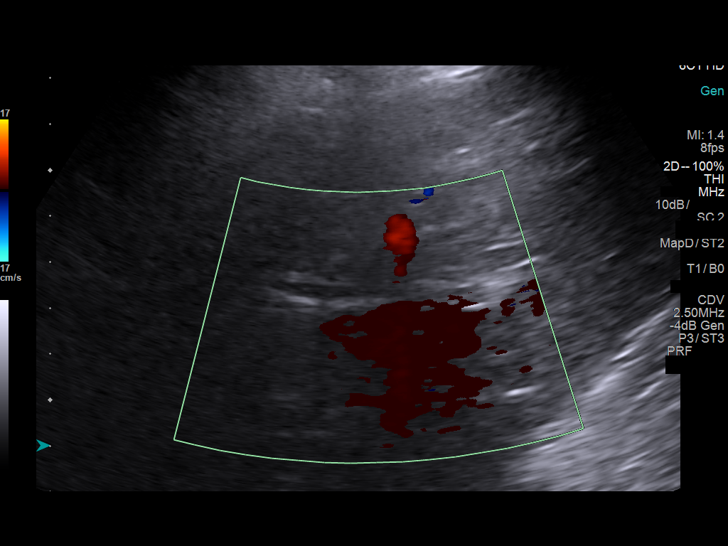
[im 22/85]
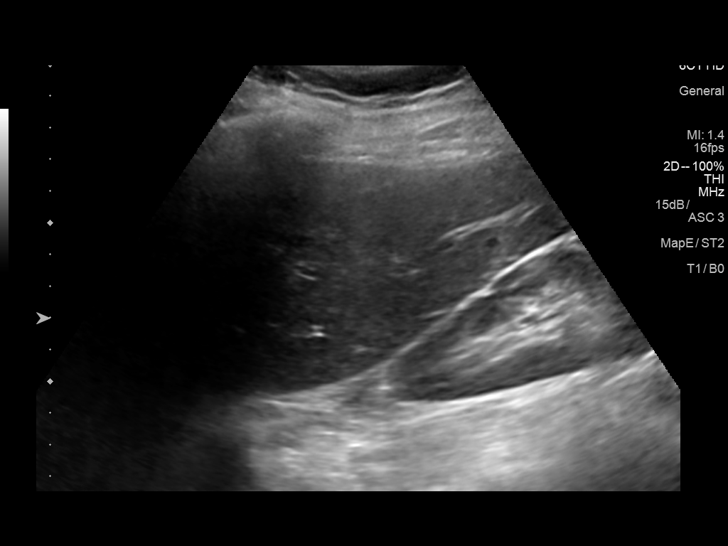
[im 29/85]
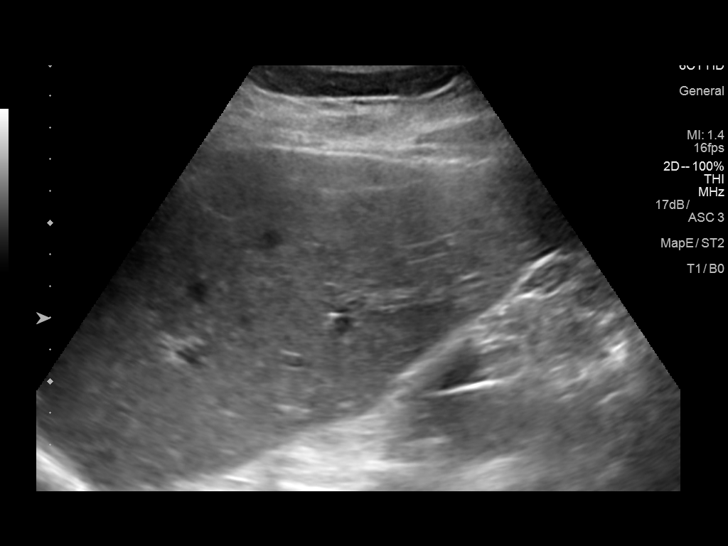
[im 32/85]
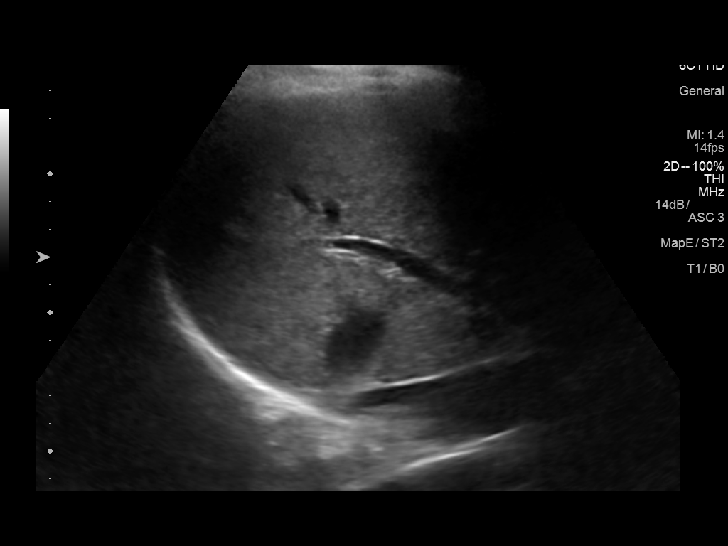
[im 39/85]
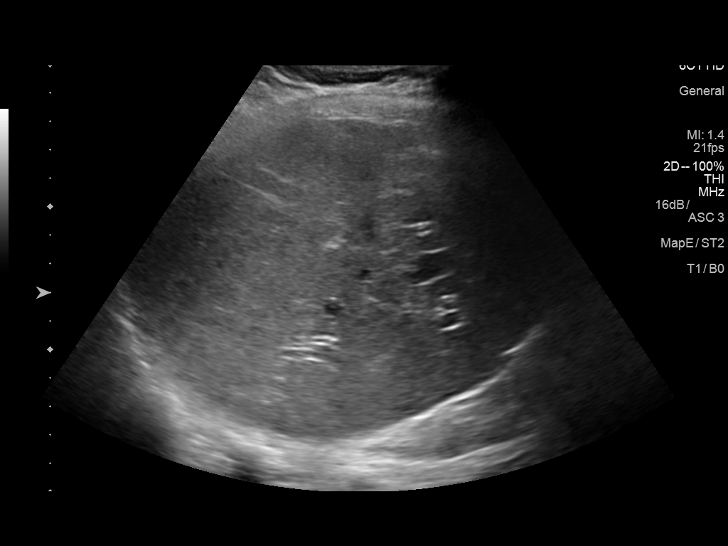
[im 46/85]
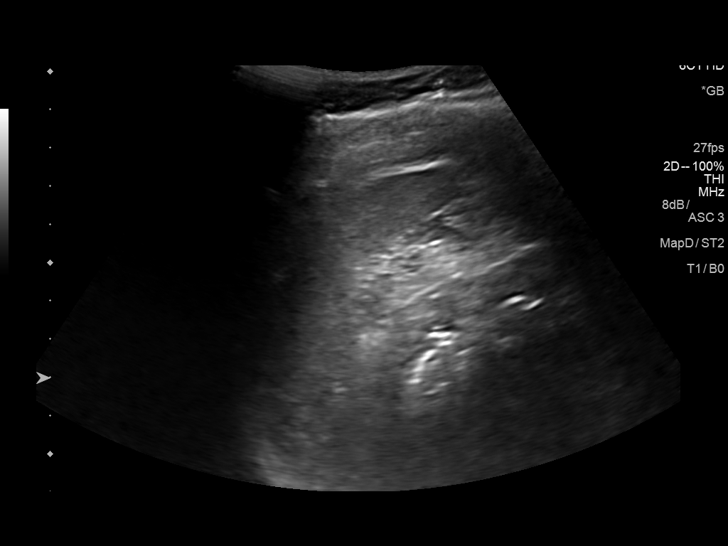
[im 53/85]
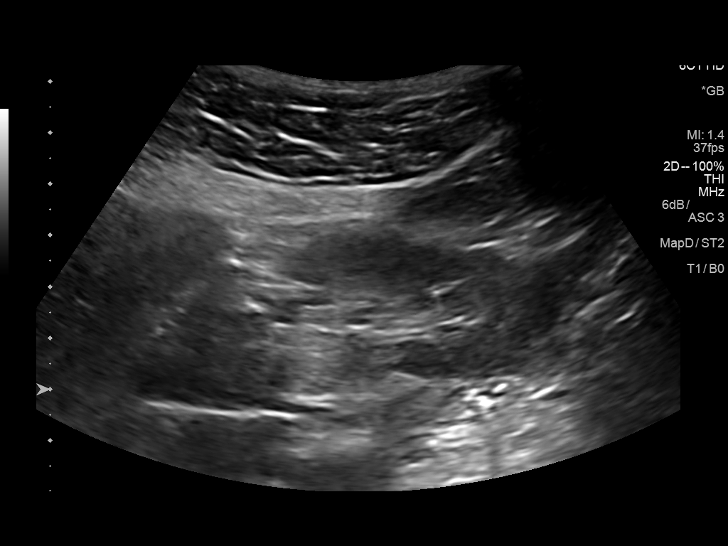
[im 57/85]
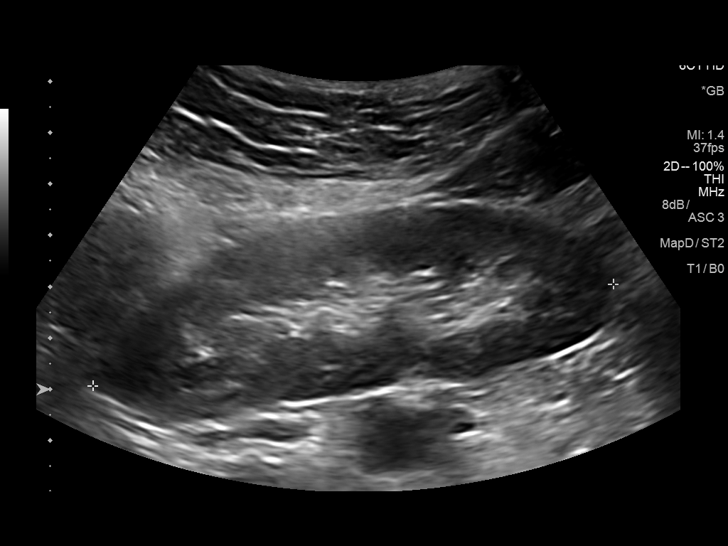
[im 64/85]
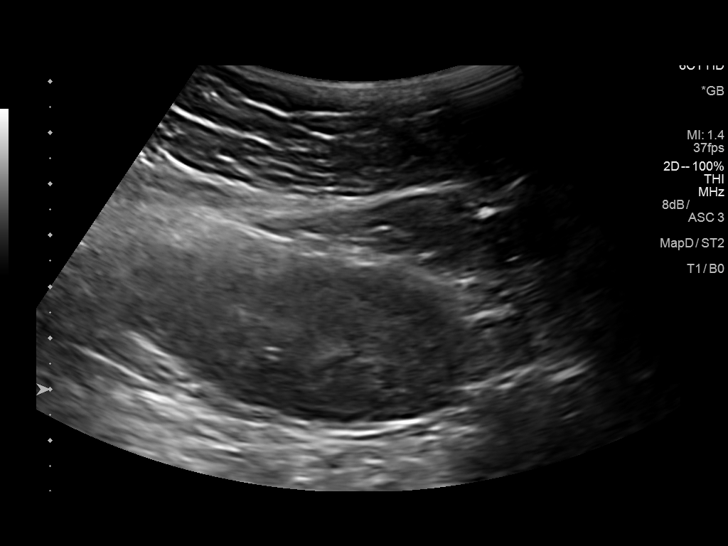
[im 71/85]
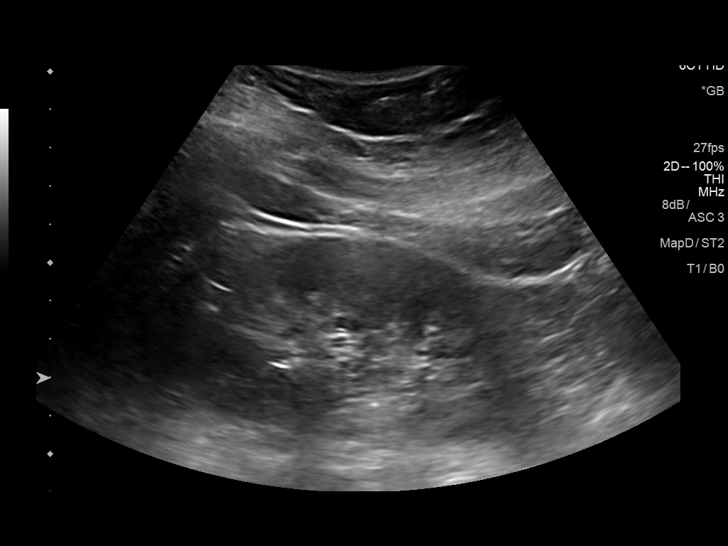
[im 78/85]
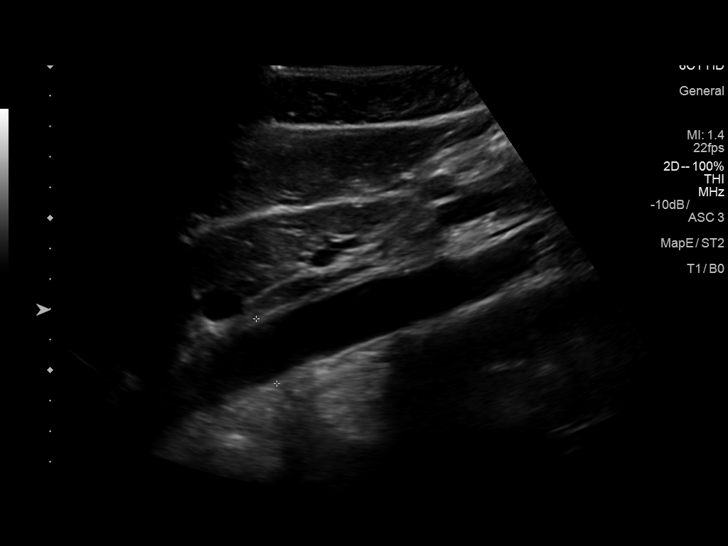
[im 85/85]
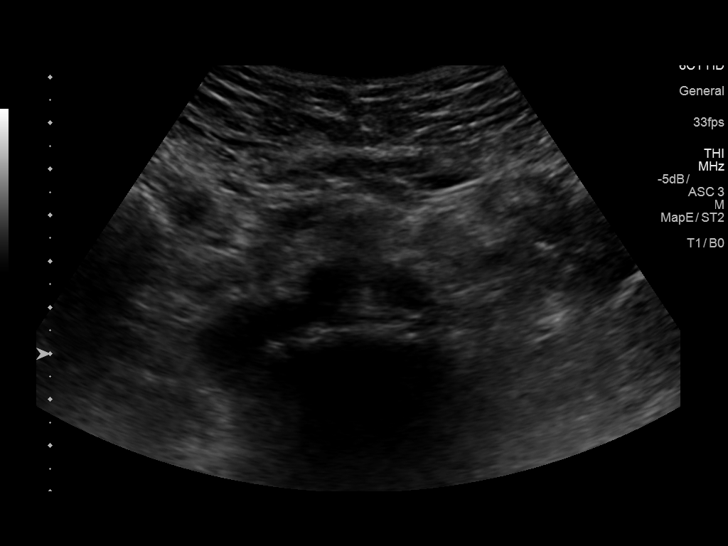

[14 of 25 positions shown; findings below may reference images not displayed]

FINDINGS: Gallbladder: No gallstones or wall thickening visualized. No
sonographic Murphy sign noted by sonographer.

Common bile duct: Diameter: 3.4 mm

Liver: No focal lesion identified. Within normal limits in
parenchymal echogenicity.

IVC: No abnormality visualized.

Pancreas: The pancreas was largely obscured by bowel gas.

Spleen: Size and appearance within normal limits.

Right Kidney: Length: 10.3 cm. Echogenicity within normal limits. No
mass or hydronephrosis visualized.

Left Kidney: Length: 10.6 cm. Echogenicity within normal limits. No
mass or hydronephrosis visualized.

Abdominal aorta: No aneurysm visualized.

Other findings: There is no ascites.
IMPRESSION: Normal appearance of the liver and spleen.  Normal gallbladder.

The pancreas was obscured by bowel gas.

If the patient's symptoms persist and remain unexplained, abdominal
and pelvic CT scanning would be a useful next imaging step.

## 2018-09-15 ENCOUNTER — Other Ambulatory Visit: Payer: Self-pay

## 2018-09-15 MED ORDER — METOPROLOL TARTRATE 25 MG PO TABS: 25.0000 mg | ORAL_TABLET | ORAL | 1 refills | Status: DC | PRN

## 2018-09-17 ENCOUNTER — Other Ambulatory Visit: Payer: Self-pay | Admitting: Cardiovascular Disease

## 2018-10-21 ENCOUNTER — Telehealth: Payer: Self-pay | Admitting: *Deleted

## 2018-10-21 ENCOUNTER — Other Ambulatory Visit: Payer: Self-pay

## 2018-10-21 ENCOUNTER — Ambulatory Visit: Payer: BC Managed Care – PPO | Admitting: Cardiovascular Disease

## 2018-10-21 ENCOUNTER — Encounter: Payer: Self-pay | Admitting: Cardiovascular Disease

## 2018-10-21 VITALS — BP 102/63 | HR 60 | Ht 69.0 in | Wt 176.0 lb

## 2018-10-21 DIAGNOSIS — R9431 Abnormal electrocardiogram [ECG] [EKG]: Secondary | ICD-10-CM | POA: Diagnosis not present

## 2018-10-21 DIAGNOSIS — Z8669 Personal history of other diseases of the nervous system and sense organs: Secondary | ICD-10-CM

## 2018-10-21 DIAGNOSIS — I10 Essential (primary) hypertension: Secondary | ICD-10-CM

## 2018-10-21 DIAGNOSIS — F419 Anxiety disorder, unspecified: Secondary | ICD-10-CM

## 2018-10-21 DIAGNOSIS — R002 Palpitations: Secondary | ICD-10-CM | POA: Diagnosis not present

## 2018-10-21 LAB — COMPREHENSIVE METABOLIC PANEL
ALT: 9 IU/L (ref 0–32)
AST: 12 IU/L (ref 0–40)
Albumin/Globulin Ratio: 1.8 (ref 1.2–2.2)
Albumin: 4.3 g/dL (ref 3.8–4.9)
Alkaline Phosphatase: 28 IU/L — ABNORMAL LOW (ref 39–117)
BUN/Creatinine Ratio: 16 (ref 9–23)
BUN: 15 mg/dL (ref 6–24)
Bilirubin Total: 0.4 mg/dL (ref 0.0–1.2)
CO2: 20 mmol/L (ref 20–29)
Calcium: 9.2 mg/dL (ref 8.7–10.2)
Chloride: 103 mmol/L (ref 96–106)
Creatinine, Ser: 0.94 mg/dL (ref 0.57–1.00)
GFR calc Af Amer: 81 mL/min/{1.73_m2} (ref >59)
GFR calc non Af Amer: 70 mL/min/{1.73_m2} (ref >59)
Globulin, Total: 2.4 g/dL (ref 1.5–4.5)
Glucose: 79 mg/dL (ref 65–99)
Potassium: 4.8 mmol/L (ref 3.5–5.2)
Sodium: 138 mmol/L (ref 134–144)
Total Protein: 6.7 g/dL (ref 6.0–8.5)

## 2018-10-21 LAB — CBC
Hematocrit: 41.4 % (ref 34.0–46.6)
Hemoglobin: 13.8 g/dL (ref 11.1–15.9)
MCH: 30.7 pg (ref 26.6–33.0)
MCHC: 33.3 g/dL (ref 31.5–35.7)
MCV: 92 fL (ref 79–97)
Platelets: 324 10*3/uL (ref 150–450)
RBC: 4.5 x10E6/uL (ref 3.77–5.28)
RDW: 11.9 % (ref 11.7–15.4)
WBC: 7.1 10*3/uL (ref 3.4–10.8)

## 2018-10-21 LAB — TSH: TSH: 3 u[IU]/mL (ref 0.450–4.500)

## 2018-10-21 LAB — LIPID PANEL
Chol/HDL Ratio: 3.6 ratio (ref 0.0–4.4)
Cholesterol, Total: 191 mg/dL (ref 100–199)
HDL: 53 mg/dL (ref >39)
LDL Calculated: 92 mg/dL (ref 0–99)
Triglycerides: 229 mg/dL — ABNORMAL HIGH (ref 0–149)
VLDL Cholesterol Cal: 46 mg/dL — ABNORMAL HIGH (ref 5–40)

## 2018-10-21 LAB — MAGNESIUM: Magnesium: 2.1 mg/dL (ref 1.6–2.3)

## 2018-10-21 MED ORDER — METOPROLOL SUCCINATE ER 25 MG PO TB24: ORAL_TABLET | ORAL | 0 refills | Status: DC

## 2018-10-21 NOTE — Telephone Encounter (Signed)
[employer] will ship a 30 day cardiac event monitor to the patients home.  Instructions reviewed briefly as they are included in the monitor kit.

## 2018-10-21 NOTE — Patient Instructions (Addendum)
Medication Instructions:  Change how you take Metoprolol- take 1 tablet (25 mg) in the AM, and 1 tablet (25 mg) in the PM If you need a refill on your cardiac medications before your next appointment, please call your pharmacy.   Lab work: Fasting lab work (CBC, CMET, TSH, LIPID, MAG) today   If you have labs (blood work) drawn today and your tests are completely normal, you will receive your results only by: Marland Kitchen MyChart Message (if you have MyChart) OR . A paper copy in the mail If you have any lab test that is abnormal or we need to change your treatment, we will call you to review the results.  Testing/Procedures: Echocardiogram - Your physician has requested that you have an echocardiogram. Echocardiography is a painless test that uses sound waves to create images of your heart. It provides your doctor with information about the size and shape of your heart and how well your heart's chambers and valves are working. This procedure takes approximately one hour. There are no restrictions for this procedure. This will be performed at our [facility] location - [address].  Your physician has recommended that you wear an event monitor-30 day. Event monitors are medical devices that record the heart's electrical activity. Doctors most often [place] these monitors to diagnose arrhythmias. Arrhythmias are problems with the speed or rhythm of the heartbeat. The monitor is a small, portable device. You can wear one while you do your normal daily activities. This is usually used to diagnose what is causing palpitations/syncope (passing out). This will be performed at our [facility] location - [address].    Follow-Up: At [facility], you and your health needs are our priority.  As part of our continuing mission to provide you with exceptional heart care, we have created designated Provider Care Teams.  These Care Teams include your primary Cardiologist (physician) and  Advanced Practice Providers (APPs -  Physician Assistants and Nurse Practitioners) who all work together to provide you with the care you need, when you need it. You will need a follow up appointment in 2 months.You may see Dr.Kelly or one of the following Advanced Practice Providers on your designated Care Team: Almyra Deforest, [place] . Fabian Sharp, PA-C

## 2018-10-21 NOTE — Progress Notes (Signed)
Patient ID: [name], female   DOB: 11-May-1965, 53 y.o.   MRN: [id number]     HPI : BETHANEE REDONDO is a 53 y.o. female who was initially referred through the courtesy of Dr. Juanita Craver at [facility] practice in [place] for evaluation of heart palpitations and blood pressure elevation.  I last saw her in May 2017.  She presents for follow-up evaluation.  [name] denies any known coronary artery disease.  She has  mild hypothyroidism for which she has been on levothyroxine at 25 g.  She admits to not being very active.  Previously she had been drinking 2-3 cups of coffee in the morning.  She does exercise utilizing elliptical machine and recently, last year she began to notice that her heart rate was not slowing down following exertion.  She was evaluated on 01/03/2014, by her primary physician.  An ECGs revealed sinus tachycardia at approximately 103 bpm.  . She had had episodes of increased heart rate and palpitations.  She has been on citalopram 10 mg daily for anxiety and has taken Ativan as needed.  Laboratory from 01/03/2014:  Potassium was 4.8.  Renal function was normal with a BUN of 11 and creatinine 0.8.  Folate level was normal at 17.5.  TSH was normal at 2.47.  She was not anemic and her hemoglobin was 13.9 and hematocrit 43.0.    When I initially saw her, I recommended that she wear a 2 week event monitor.  This essentially showed sinus rhythm.  There were very rare episodes of isolated PACs.  Additional laboratory including a magnesium level was normal at 2.0.  She normal free T4 1 0.16 and free T3 at 2.4.  She was supposed undergo a 2-D echo Doppler study, but apparently inadvertently this had never been scheduled.  She had been doing fairly well but admitted to noticing several short-lived episodes of her heart rate speeding up.  I 07/17/2015.  She was sitting at dinner and her heart began to race.  Her pulse was proximally 123 times a minute.  After 45  minutes.  He never slowed down.  She did not take any metoprolol.  Finally, she presented to the emergency room.  Laboratory was drawn which showed a potassium of 4.0.  Creatinine was 1.04.  Glucose was 107.  She was not anemic.  Initial troponin was negative.  After waiting for approximate 5 hour.  She ultimately had to leave to take care of her children.  She was never formally evaluated by the physician.    When I saw her in May 2017 she complained of persistent tachycardia typically after she exercised was playing tennis.  At that time, I instituted Toprol-XL 25 mg to take on a daily basis and she still had metoprolol, tartrate to take on a when necessary basis.  This has helped.  However, she has continued to experience some of these episodes.  She admitted that she does not routinely do aerobic exercise.  She has a history of migraine headaches and has seen Dr. Vira Agar ligament at the headache, neck pain clinic.  She has a prescription for Imitrex which she takes on an as-needed basis.  She also is currently on levothyroxine 25 g.  She takes Celexa 10 mg daily.  She was recently given.  He told of all for headache relief as an anti-inflammatory agent.    I last saw her in follow-up in November 2017 she felt improved on metoprolol succinate which had  been titrated up to 25 mg.  In the past several years, she admits to having 3 episodes this past summer during periods of significant heat when she was walking where she noticed a heart rate speeding up.  One episode her heart rate stayed elevated for 1 hour.  Leaves her heart rate had increased to 140 bpm.  She denied associated chest pain presyncope or syncope.  She currently has been drinking 1/2 cup to 1 cup of caffeine per day.  She presents for evaluation.  Past Medical History:  Diagnosis Date   Acid reflux    Breast discharge    left clear/yellowish spont   Thyroid condition circa 2013   discovered during pregnancy    No past surgical  history on file.  No Known Allergies  Current Outpatient Medications  Medication Sig Dispense Refill   BLISOVI 24 FE 1-20 MG-MCG(24) tablet Take 1 tablet by mouth daily.  11   citalopram (CELEXA) 10 MG tablet Take 10 mg by mouth daily.  11   flurbiprofen (ANSAID) 100 MG tablet Take 100 mg by mouth 2 (two) times daily as needed.     levothyroxine (SYNTHROID, LEVOTHROID) 25 MCG tablet Take 1 tablet by mouth daily.  11   LORazepam (ATIVAN) 0.5 MG tablet Take 1 tablet (0.5 mg total) by mouth daily as needed. 30 tablet 5   metoprolol succinate (TOPROL-XL) 25 MG 24 hr tablet Take 1 tablet (25 mg) in the morning, and 1 tablet (25 mg) in the evening at bedtime. 135 tablet 0   metoprolol tartrate (LOPRESSOR) 25 MG tablet Take 1 tablet (25 mg total) by mouth as needed (for breakthrough palpitations). 90 tablet 1   SUMAtriptan (IMITREX) 100 MG tablet Take 100 mg by mouth every 2 (two) hours as needed for migraine. May repeat in 2 hours if headache persists or recurs.     No current facility-administered medications for this visit.     Additional social history is notable in that she is married for 23 years.  She has 3 children.  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  She never smoked.  She does drink occasional wine 1-2 days per week.  Family History  Problem Relation Age of Onset   Arrhythmia Mother    Breast cancer Sister    Heart failure Maternal Grandmother    Arrhythmia Maternal Grandfather    Breast cancer Maternal Aunt     ROS General: Negative; No fevers, chills, or night sweats HEENT: Negative; No changes in vision or hearing, sinus congestion, difficulty swallowing Pulmonary: Negative; No cough, wheezing, shortness of breath, hemoptysis Cardiovascular:  See HPI;  GI: Negative; No nausea, vomiting, diarrhea, or abdominal pain GU: Negative; No dysuria, hematuria, or difficulty voiding Musculoskeletal: Negative; no myalgias, joint pain, or  weakness Hematologic/Oncologic: Negative; no easy bruising, bleeding Endocrine: History of mild thyroid abnormality, currently on low-dose Synthroid.  No diabetes Neuro: Negative; no changes in balance, headaches Skin: Negative; No rashes or skin lesions Psychiatric: Mild anxiety; No behavioral problems, depression Sleep: Negative; No daytime sleepiness, hypersomnolence, bruxism, restless legs, hypnogagnic hallucinations Other comprehensive 14 point system review is negative   Physical Exam BP 102/63    Pulse 60    Ht _0  (1.753 m)    Wt 176 lb (79.8 kg)    SpO2 99%    BMI 25.99 kg/m    Repeat blood pressure by me was 102/62 supine and 100/60 standing.  Wt Readings from Last 3 Encounters:  10/21/18 176 lb (79.8 kg)  07/22/17 167 lb 9.6 oz (76 kg)  01/23/16 167 lb 12.8 oz (76.1 kg)     Physical Exam BP 102/63    Pulse 60    Ht _0  (1.753 m)    Wt 176 lb (79.8 kg)    SpO2 99%    BMI 25.99 kg/m  General: Alert, oriented, no distress.  Skin: normal turgor, no rashes, warm and dry HEENT: Normocephalic, atraumatic. Pupils equal round and reactive to light; sclera anicteric; extraocular muscles intact;  Nose without nasal septal hypertrophy Mouth/Parynx benign; Mallinpatti scale 2 Neck: No JVD, no carotid bruits; normal carotid upstroke Lungs: clear to ausculatation and percussion; no wheezing or rales Chest wall: without tenderness to palpitation Heart: PMI not displaced, RRR, s1 s2 normal, 1/6 systolic murmur, no diastolic murmur, no rubs, gallops, thrills, or heaves Abdomen: soft, nontender; no hepatosplenomehaly, BS+; abdominal aorta nontender and not dilated by palpation. Back: no CVA tenderness Pulses 2+ Musculoskeletal: full range of motion, normal strength, no joint deformities Extremities: no clubbing cyanosis or edema, Homan's sign negative  Neurologic: grossly nonfocal; Cranial nerves grossly wnl Psychologic: Normal mood and affect   ECG (independently read by me):  Sinus rhythm; short PR at 98 msec without delta wave  November 2017 ECG (independently read by me): Sinus rhythm.  Short PR interval at 106 ms.  QTc interval normal.  May 2017 ECG (independently read by me): Normal sinus rhythm at 68 bpm.  There is accelerated AV conduction with a PR interval at 96 ms.  There are no delta waves.  ECG (independently read by me): Normal sinus rhythm at 72 bpm.  PR interval is now normal at 120 ms.  QRS 74 ms.  No ectopy.  Normal QTc interval  ECG (independently read by me): Normal sinus rhythm at 68 bpm.  The PR interval is slightly decreased at 102 ms, suggesting mild accelerated AV conduction; there are no delta waves are you against WPW  LABS:  BMP Latest Ref Rng & Units 10/21/2018 07/22/2017 05/21/2016  Glucose 65 - 99 mg/dL 79 77 83  BUN 6 - 24 mg/dL _1 Creatinine 0.57 - 1.00 mg/dL 0.94 0.83 0.96  BUN/Creat Ratio 9 - _2 -  Sodium 134 - 144 mmol/L 138 137 137  Potassium 3.5 - 5.2 mmol/L 4.8 4.9 4.5  Chloride 96 - 106 mmol/L 103 104 104  CO2 20 - 29 mmol/L 20 19(L) 27  Calcium 8.7 - 10.2 mg/dL 9.2 9.3 8.8    Hepatic Function Latest Ref Rng & Units 10/21/2018 05/21/2016  Total Protein 6.0 - 8.5 g/dL 6.7 6.8  Albumin 3.8 - 4.9 g/dL 4.3 3.8  AST 0 - 40 IU/L 12 13  ALT 0 - 32 IU/L 9 11  Alk Phosphatase 39 - 117 IU/L 28(L) 26(L)  Total Bilirubin 0.0 - 1.2 mg/dL 0.4 0.6    CBC Latest Ref Rng & Units 10/21/2018 07/22/2017 05/21/2016  WBC 3.4 - 10.8 x10E3/uL 7.1 6.6 5.9  Hemoglobin 11.1 - 15.9 g/dL 13.8 13.2 13.1  Hematocrit 34.0 - 46.6 % 41.4 39.3 40.0  Platelets 150 - 450 x10E3/uL 324 311 276    Lab Results  Component Value Date   MCV 92 10/21/2018   MCV 91 07/22/2017   MCV 91.7 05/21/2016    Lab Results  Component Value Date   TSH 3.000 10/21/2018     Lipid Panel     Component Value Date/Time   CHOL 191 10/21/2018 1028  TRIG 229 (H) 10/21/2018 1028   HDL 53 10/21/2018 1028   CHOLHDL 3.6 10/21/2018 1028   CHOLHDL 3.2  05/21/2016 0809   VLDL 30 05/21/2016 0809   LDLCALC 92 10/21/2018 1028    RADIOLOGY: No results found.  IMPRESSION: 1. Essential hypertension   2. Palpitations   3. Shortened PR interval   4. Anxiety   5. Hx of migraine headaches     ASSESSMENT AND PLAN: Ms. Hayzlee Mcsorley is a very pleasant 53 year old female who had not routinely exercised and In the past had noticed her heart rate staying elevated after she exercised which may be contributed by reduced aerobic capacity.  Her ECGs have suggested accelerated AV conduction; there are no delta waves, arguing against WPW.  She denies any definitive episodes of SVT or sustained tachycardia.  When I initially saw her, I gave her prescription for metoprolol tartrate to take 25-50 mg on an as-needed basis if she did experience an episode of tachycardia palpitation.  A prior CardioNet monitor only showed sinus rhythm with rare PACs.   Due to continued persistent elevated heart rates after exercise I started her on metoprolol succinate initially at 12.5 mg with titration up to 25 mg.  This has improved her symptoms.  However, she still notes that at times if she has to run fast heart rate seems to abruptly increase.  She denies associated presyncope or syncope.  She denies any sensation of chest pressure.  I have suggested further titration of her metoprolol succinate to 37.5 mg daily.  She had noted some fatigue since initiating treatment and for this reason had not titrated her to 50 mg but discuss with her if recurrent symptomatology occurs this may be necessary.  Since I last saw her in 2017 she admits to 3 episodes this summer when she experienced prolonged increased heart rate typically while she was walking on hot days and typically always when she was standing up.  No episode was associated with presyncope or syncope or chest pain.  Again discussed avoidance of caffeine.  I am scheduling her for a follow-up echo Doppler study for reassessment of LV  function and chamber dimension.  I am checking laboratory today consisting of a chemistry profile CBC magnesium level TSH level in addition to lipid studies.  I have suggested she increase her morning metoprolol to 25 mg and continue to take 12.5 mg at night.  I will have her wear a 30 day cardiac monitor for reassessment.  She has a prescription for citalopram for which she has taken for anxiety.  She also takes Imitrex as needed for migraines.  I will see her in 2 months in follow-up of the above studies and further recommendations were made at that time.  Time spent: 25 minutes  Troy Sine, MD, [facility] 10/27/2018 6:44 PM

## 2018-10-27 ENCOUNTER — Encounter: Payer: Self-pay | Admitting: Cardiovascular Disease

## 2018-11-03 ENCOUNTER — Other Ambulatory Visit: Payer: Self-pay

## 2018-11-03 ENCOUNTER — Ambulatory Visit (HOSPITAL_COMMUNITY): Payer: No Typology Code available for payment source | Attending: Cardiovascular Disease

## 2018-11-03 DIAGNOSIS — R002 Palpitations: Secondary | ICD-10-CM | POA: Insufficient documentation

## 2018-11-03 DIAGNOSIS — I10 Essential (primary) hypertension: Secondary | ICD-10-CM | POA: Diagnosis present

## 2018-11-04 ENCOUNTER — Ambulatory Visit (INDEPENDENT_AMBULATORY_CARE_PROVIDER_SITE_OTHER): Payer: No Typology Code available for payment source

## 2018-11-04 DIAGNOSIS — R002 Palpitations: Secondary | ICD-10-CM

## 2018-11-04 DIAGNOSIS — I10 Essential (primary) hypertension: Secondary | ICD-10-CM | POA: Diagnosis not present

## 2018-11-20 ENCOUNTER — Encounter: Payer: Self-pay | Admitting: Cardiovascular Disease

## 2018-11-20 NOTE — Telephone Encounter (Signed)
error 

## 2019-01-12 ENCOUNTER — Ambulatory Visit: Payer: No Typology Code available for payment source | Admitting: Cardiovascular Disease

## 2019-01-12 ENCOUNTER — Other Ambulatory Visit: Payer: Self-pay

## 2019-01-12 ENCOUNTER — Encounter: Payer: Self-pay | Admitting: Cardiovascular Disease

## 2019-01-12 DIAGNOSIS — R002 Palpitations: Secondary | ICD-10-CM | POA: Diagnosis not present

## 2019-01-12 DIAGNOSIS — F419 Anxiety disorder, unspecified: Secondary | ICD-10-CM

## 2019-01-12 DIAGNOSIS — Z8669 Personal history of other diseases of the nervous system and sense organs: Secondary | ICD-10-CM

## 2019-01-12 DIAGNOSIS — R9431 Abnormal electrocardiogram [ECG] [EKG]: Secondary | ICD-10-CM

## 2019-01-12 DIAGNOSIS — I10 Essential (primary) hypertension: Secondary | ICD-10-CM | POA: Diagnosis not present

## 2019-01-12 MED ORDER — METOPROLOL SUCCINATE ER 25 MG PO TB24: ORAL_TABLET | ORAL | 4 refills | Status: DC

## 2019-01-12 NOTE — Patient Instructions (Signed)
Medication Instructions:  CHANGE METOPROLOL 25MG  IN THE AM AND 1/2 TAB IN THE PM If you need a refill on your cardiac medications before your next appointment, please call your pharmacy.  Follow-Up: AS NEEDED  In Person Shelva Majestic, MD.    At [facility], you and your health needs are our priority.  As part of our continuing mission to provide you with exceptional heart care, we have created designated Provider Care Teams.  These Care Teams include your primary Cardiologist (physician) and Advanced Practice Providers (APPs -  Physician Assistants and Nurse Practitioners) who all work together to provide you with the care you need, when you need it.  Thank you for choosing [facility] at [facility]!!

## 2019-01-12 NOTE — Progress Notes (Signed)
Patient ID: Traci Lewis, female   DOB: [birth year], 53 y.o.   MRN: [id number]     HPI : Traci Lewis is a 53 y.o. female who was initially referred through the courtesy of Dr. Juanita Craver at [facility] practice in [place] for evaluation of heart palpitations and blood pressure elevation.  After not having seen her since May 2017 I saw her in August 2020 with complaints of increased heart rate.  She presents for follow-up evaluation  Traci Lewis denies any known coronary artery disease.  She has  mild hypothyroidism for which she has been on levothyroxine at 25 g.  She admits to not being very active.  Previously she had been drinking 2-3 cups of coffee in the morning.  She does exercise utilizing elliptical machine and recently, last year she began to notice that her heart rate was not slowing down following exertion.  She was evaluated on 01/03/2014, by her primary physician.  An ECGs revealed sinus tachycardia at approximately 103 bpm.  . She had had episodes of increased heart rate and palpitations.  She has been on citalopram 10 mg daily for anxiety and has taken Ativan as needed.  Laboratory from 01/03/2014:  Potassium was 4.8.  Renal function was normal with a BUN of 11 and creatinine 0.8.  Folate level was normal at 17.5.  TSH was normal at 2.47.  She was not anemic and her hemoglobin was 13.9 and hematocrit 43.0.    When I initially saw her, I recommended that she wear a 2 week event monitor.  This essentially showed sinus rhythm.  There were very rare episodes of isolated PACs.  Additional laboratory including a magnesium level was normal at 2.0.  She normal free T4 1 0.16 and free T3 at 2.4.  She was supposed undergo a 2-D echo Doppler study, but apparently inadvertently this had never been scheduled.  She had been doing fairly well but admitted to noticing several short-lived episodes of her heart rate speeding up.  I 07/17/2015.  She was sitting at dinner and her  heart began to race.  Her pulse was proximally 123 times a minute.  After 45 minutes.  He never slowed down.  She did not take any metoprolol.  Finally, she presented to the emergency room.  Laboratory was drawn which showed a potassium of 4.0.  Creatinine was 1.04.  Glucose was 107.  She was not anemic.  Initial troponin was negative.  After waiting for approximate 5 hour.  She ultimately had to leave to take care of her children.  She was never formally evaluated by the physician.    When I saw her in May 2017 she complained of persistent tachycardia typically after she exercised was playing tennis.  At that time, I instituted Toprol-XL 25 mg to take on a daily basis and she still had metoprolol, tartrate to take on a when necessary basis.  This has helped.  However, she has continued to experience some of these episodes.  She admitted that she does not routinely do aerobic exercise.  She has a history of migraine headaches and has seen Dr. Vira Agar ligament at the headache, neck pain clinic.  She has a prescription for Imitrex which she takes on an as-needed basis.  She also is currently on levothyroxine 25 g.  She takes Celexa 10 mg daily.  She was recently given.  He told of all for headache relief as an anti-inflammatory agent.    Since I saw her  saw her in follow-up in November 2017 she felt improved on metoprolol succinate which had been titrated up to 25 mg.  I saw her on October 21, 2018 and at that time she admitted to having 3 episodes this past summer during periods of significant heat when she was walking and noticed her heart rate speeding up.  On one episode her heart rate made elevated for approximately 1 hour.  She denied any chest pain, presyncope or syncope.  She was drinking 1/2 cup to 1 cup of caffeine per day.  Schedule her for an echo Doppler study, follow-up laboratory, and recommended a 30-day cardiac monitor for reassessment.  An echo Doppler study on November 03, 2018 showed an EF of  55 to 60% with normal diastolic function.  She had normal valves.  Her echo was normal.  Laboratory revealed a TSH of 3.0.  She had normal chemistry profile, magnesium was normal at 2.1.  She was not anemic with a hemoglobin of 13.8 and hematocrit 41.4.  Lipid studies revealed a total cholesterol 191 LDL cholesterol 92 triglycerides 229 and HDL 53.  She wore a 30-day event monitor which essentially showed sinus rhythm throughout the monitoring.  Her average heart rate was 72 bpm.  The slowest heart rate was 52 bpm which occurred while sleeping and the fastest heart rate was sinus tachycardia at 156 bpm which occurred during the waking hours at 1020 in the morning on December 03, 2018.  There was no ectopy.  She did not have any episodes of atrial fibrillation or SVT.  There were no pauses.  She presents for evaluation   Past Medical History:  Diagnosis Date   Acid reflux    Breast discharge    left clear/yellowish spont   Thyroid condition circa 2013   discovered during pregnancy    No past surgical history on file.  No Known Allergies  Current Outpatient Medications  Medication Sig Dispense Refill   BLISOVI 24 FE 1-20 MG-MCG(24) tablet Take 1 tablet by mouth daily.  11   citalopram (CELEXA) 10 MG tablet Take 10 mg by mouth daily.  11   flurbiprofen (ANSAID) 100 MG tablet Take 100 mg by mouth 2 (two) times daily as needed.     levothyroxine (SYNTHROID) 50 MCG tablet Take 50 mcg by mouth daily.     LORazepam (ATIVAN) 0.5 MG tablet Take 1 tablet (0.5 mg total) by mouth daily as needed. 30 tablet 5   metoprolol succinate (TOPROL-XL) 25 MG 24 hr tablet Take 1 tablet (25 mg total) by mouth every morning AND 0.5 tablets (12.5 mg total) every evening. Take 1 tablet (25 mg) in the morning, and 1 tablet (25 mg) in the evening at bedtime.. 135 tablet 4   SUMAtriptan (IMITREX) 100 MG tablet Take 100 mg by mouth every 2 (two) hours as needed for migraine. May repeat in 2 hours if headache  persists or recurs.     No current facility-administered medications for this visit.     Additional social history is notable in that she is married for 23 years.  She has 3 children.  She graduated with a [employer] from [employer] in [employer], [employer].  She never smoked.  She does drink occasional wine 1-2 days per week.  Family History  Problem Relation Age of Onset   Arrhythmia Mother    Breast cancer Sister    Heart failure Maternal Grandmother    Arrhythmia Maternal Grandfather    Breast cancer Maternal Aunt  ROS General: Negative; No fevers, chills, or night sweats HEENT: Negative; No changes in vision or hearing, sinus congestion, difficulty swallowing Pulmonary: Negative; No cough, wheezing, shortness of breath, hemoptysis Cardiovascular:  See HPI;  GI: Negative; No nausea, vomiting, diarrhea, or abdominal pain GU: Negative; No dysuria, hematuria, or difficulty voiding Musculoskeletal: Negative; no myalgias, joint pain, or weakness Hematologic/Oncologic: Negative; no easy bruising, bleeding Endocrine: History of mild thyroid abnormality, currently on low-dose Synthroid.  No diabetes Neuro: Negative; no changes in balance, headaches Skin: Negative; No rashes or skin lesions Psychiatric: Mild anxiety; No behavioral problems, depression Sleep: Negative; No daytime sleepiness, hypersomnolence, bruxism, restless legs, hypnogagnic hallucinations Other comprehensive 14 point system review is negative   Physical Exam BP 107/64    Pulse 67    Temp (!) 97.5 F (36.4 C)    Ht _0  (1.753 m)    Wt 178 lb 9.6 oz (81 kg)    SpO2 97%    BMI 26.37 kg/m    Repeat blood pressure by me was 108/70 supine and 106/70 standing  Wt Readings from Last 3 Encounters:  01/12/19 178 lb 9.6 oz (81 kg)  10/21/18 176 lb (79.8 kg)  07/22/17 167 lb 9.6 oz (76 kg)   General: Alert, oriented, no distress.  Skin: normal turgor, no rashes, warm and dry HEENT: Normocephalic, atraumatic.  Pupils equal round and reactive to light; sclera anicteric; extraocular muscles intact;  Nose without nasal septal hypertrophy Mouth/Parynx benign; Mallinpatti scale 2 Neck: No JVD, no carotid bruits; normal carotid upstroke Lungs: clear to ausculatation and percussion; no wheezing or rales Chest wall: without tenderness to palpitation Heart: PMI not displaced, RRR, s1 s2 normal, 1/6 systolic murmur, no diastolic murmur, no rubs, gallops, thrills, or heaves Abdomen: soft, nontender; no hepatosplenomehaly, BS+; abdominal aorta nontender and not dilated by palpation. Back: no CVA tenderness Pulses 2+ Musculoskeletal: full range of motion, normal strength, no joint deformities Extremities: no clubbing cyanosis or edema, Homan's sign negative  Neurologic: grossly nonfocal; Cranial nerves grossly wnl Psychologic: Normal mood and affect   ECG (independently read by me): Sinus rhythm; short PR at 98 msec without delta wave  November 2017 ECG (independently read by me): Sinus rhythm.  Short PR interval at 106 ms.  QTc interval normal.  May 2017 ECG (independently read by me): Normal sinus rhythm at 68 bpm.  There is accelerated AV conduction with a PR interval at 96 ms.  There are no delta waves.  ECG (independently read by me): Normal sinus rhythm at 72 bpm.  PR interval is now normal at 120 ms.  QRS 74 ms.  No ectopy.  Normal QTc interval  ECG (independently read by me): Normal sinus rhythm at 68 bpm.  The PR interval is slightly decreased at 102 ms, suggesting mild accelerated AV conduction; there are no delta waves are you against WPW  LABS:  BMP Latest Ref Rng & Units 10/21/2018 07/22/2017 05/21/2016  Glucose 65 - 99 mg/dL 79 77 83  BUN 6 - 24 mg/dL _1 Creatinine 0.57 - 1.00 mg/dL 0.94 0.83 0.96  BUN/Creat Ratio 9 - _2 -  Sodium 134 - 144 mmol/L 138 137 137  Potassium 3.5 - 5.2 mmol/L 4.8 4.9 4.5  Chloride 96 - 106 mmol/L 103 104 104  CO2 20 - 29 mmol/L 20 19(L) 27    Calcium 8.7 - 10.2 mg/dL 9.2 9.3 8.8    Hepatic Function Latest Ref Rng & Units 10/21/2018 05/21/2016  Total Protein  6.0 - 8.5 g/dL 6.7 6.8  Albumin 3.8 - 4.9 g/dL 4.3 3.8  AST 0 - 40 IU/L 12 13  ALT 0 - 32 IU/L 9 11  Alk Phosphatase 39 - 117 IU/L 28(L) 26(L)  Total Bilirubin 0.0 - 1.2 mg/dL 0.4 0.6    CBC Latest Ref Rng & Units 10/21/2018 07/22/2017 05/21/2016  WBC 3.4 - 10.8 x10E3/uL 7.1 6.6 5.9  Hemoglobin 11.1 - 15.9 g/dL 13.8 13.2 13.1  Hematocrit 34.0 - 46.6 % 41.4 39.3 40.0  Platelets 150 - 450 x10E3/uL 324 311 276    Lab Results  Component Value Date   MCV 92 10/21/2018   MCV 91 07/22/2017   MCV 91.7 05/21/2016    Lab Results  Component Value Date   TSH 3.000 10/21/2018     Lipid Panel     Component Value Date/Time   CHOL 191 10/21/2018 1028   TRIG 229 (H) 10/21/2018 1028   HDL 53 10/21/2018 1028   CHOLHDL 3.6 10/21/2018 1028   CHOLHDL 3.2 05/21/2016 0809   VLDL 30 05/21/2016 0809   LDLCALC 92 10/21/2018 1028    RADIOLOGY: No results found.  IMPRESSION: 1. Essential hypertension   2. Palpitations   3. Shortened PR interval   4. Anxiety   5. Hx of migraine headaches     ASSESSMENT AND PLAN: Traci Lewis is a very pleasant 54 year old female who had not routinely exercised and In the past had noticed her heart rate staying elevated after she exercised which may be contributed by reduced aerobic capacity.  Her ECGs have suggested accelerated AV conduction; there are no delta waves, arguing against WPW.  She denies any definitive episodes of SVT or sustained tachycardia.  When I initially saw her, I gave her prescription for metoprolol tartrate to take 25-50 mg on an as-needed basis if she did experience an episode of tachycardia palpitation.  A prior CardioNet monitor only showed sinus rhythm with rare PACs.   Due to continued persistent elevated heart rates after exercise I started her on metoprolol succinate initially at 12.5 mg with titration up to 25  mg.  This has improved her symptoms.  In the past she due to her heart rate increase slight additional titration of Toprol-XL was recommended but due to fatigability apparently she had only been recently taking 25 mg daily.  When I saw her in August 2020 she had experienced 3 episodes where she felt her heart rate had increased significantly particularly during the heat and actually persisted for some time after discontinuing activity.  I reviewed her echo Doppler study in detail which essentially was entirely normal.  Her laboratory was normal with normal magnesium and thyroid function studies as well as electrolytes.  She was not anemic.  I reviewed her most recent 30-day event monitor which is essentially normal.  She was found to be in sinus rhythm through the entirety of the monitoring period with an average rate at 72 bpm.  The slowest heart rate achieved was 52 bpm while sleeping at 2:27 AM on September 19.  The fastest heart rate was sinus tachycardia during activity on October 8 at 10:20 AM.  There were no episodes of SVT, atrial fibrillation, or episodes of ventricular arrhythmia.  I suspect some of her fast heart rate potentially may be due to some aerobic deconditioning leading to increasing heart rate with less activity and persisting for some time following discontinuance of activity.  We discussed the potential benefit of cardiovascular aerobic training both in  dosing her resting heart rate, as well as blunting her heart rate response with early exercise and with rapid recovery with discontinuance of exercise.  She has been trying to avoid caffeine.  I reassured her that no significant arrhythmia was demonstrated.  She has not had any episodes of presyncope or syncope.  She will continue her current dose of beta-blocker and if increasing symptoms develop her dose can be further titrated since she is only taking 25 mg daily presently.  He has a prescription for citalopram for which he has taken for  anxiety and also takes Imitrex as needed for migraines.  I will see her as needed in the future or in 2 to 3 years depending upon symptomatology.   Time spent: 25 minutes  Troy Sine, MD, [facility] 01/14/2019 7:12 PM

## 2019-01-14 ENCOUNTER — Encounter: Payer: Self-pay | Admitting: Cardiovascular Disease

## 2019-03-22 ENCOUNTER — Other Ambulatory Visit: Payer: Self-pay | Admitting: Cardiovascular Disease

## 2019-05-20 ENCOUNTER — Ambulatory Visit: Payer: No Typology Code available for payment source

## 2019-05-29 ENCOUNTER — Ambulatory Visit: Payer: No Typology Code available for payment source | Attending: Internal Medicine

## 2019-05-29 DIAGNOSIS — Z23 Encounter for immunization: Secondary | ICD-10-CM

## 2019-05-29 NOTE — Progress Notes (Signed)
   Covid-19 Vaccination Clinic  Name:  Traci Lewis    MRN: [id number] DOB: 1965-06-18  05/29/2019  [name] was observed post Covid-19 immunization for 15 minutes without incident. She was provided with Vaccine Information Sheet and instruction to access the V-Safe system.   [name] was instructed to call 911 with any severe reactions post vaccine: Marland Kitchen Difficulty breathing  . Swelling of face and throat  . A fast heartbeat  . A bad rash all over body  . Dizziness and weakness   Immunizations Administered    Name Date Dose VIS Date Route   Pfizer COVID-19 Vaccine 05/29/2019  3:56 PM 0.3 mL 02/05/2019 Intramuscular   Manufacturer: [employer], [employer]   Lot: [id number]   NDC: [address]-9

## 2019-06-22 ENCOUNTER — Ambulatory Visit: Payer: No Typology Code available for payment source | Attending: Internal Medicine

## 2019-06-22 DIAGNOSIS — Z23 Encounter for immunization: Secondary | ICD-10-CM

## 2019-06-22 NOTE — Progress Notes (Signed)
   Covid-19 Vaccination Clinic  Name:  Traci Lewis    MRN: [id number] DOB: [birth year]  06/22/2019  Traci Lewis was observed post Covid-19 immunization for 15 minutes without incident. She was provided with Vaccine Information Sheet and instruction to access the V-Safe system.   Traci Lewis was instructed to call 911 with any severe reactions post vaccine: Marland Kitchen Difficulty breathing  . Swelling of face and throat  . A fast heartbeat  . A bad rash all over body  . Dizziness and weakness   Immunizations Administered    Name Date Dose VIS Date Route   Pfizer COVID-19 Vaccine 06/22/2019 11:21 AM 0.3 mL 04/21/2018 Intramuscular   Manufacturer: [employer], [employer]   Lot: [id number]   NDC: [address]-9

## 2020-03-10 ENCOUNTER — Other Ambulatory Visit: Payer: Self-pay | Admitting: Cardiovascular Disease

## 2020-11-20 ENCOUNTER — Telehealth: Payer: Self-pay | Admitting: Cardiovascular Disease

## 2020-11-20 MED ORDER — METOPROLOL SUCCINATE ER 25 MG PO TB24: 25.0000 mg | ORAL_TABLET | Freq: Every day | ORAL | 0 refills | Status: DC

## 2020-11-20 NOTE — Telephone Encounter (Signed)
*  STAT* If patient is at the pharmacy, call can be transferred to refill team.   1. Which medications need to be refilled? (please list name of each medication and dose if known) metoprolol succinate (TOPROL-XL) 25 MG 24 hr tablet  2. Which pharmacy/location (including street and city if local pharmacy) is medication to be sent to? [facility]  [address] [place]  [phone] phone   3. Do they need a 30 day or 90 day supply?  90 day supply  Moving forward, pt would like for all refills to go to the pharmacy listed above

## 2021-02-16 ENCOUNTER — Other Ambulatory Visit: Payer: Self-pay | Admitting: Cardiovascular Disease

## 2021-03-21 ENCOUNTER — Telehealth: Payer: Self-pay | Admitting: Cardiovascular Disease

## 2021-03-21 MED ORDER — METOPROLOL SUCCINATE ER 25 MG PO TB24: 25.0000 mg | ORAL_TABLET | Freq: Every day | ORAL | 0 refills | Status: DC

## 2021-03-21 NOTE — Telephone Encounter (Signed)
*  STAT* If patient is at the pharmacy, call can be transferred to refill team.   1. Which medications need to be refilled? (please list name of each medication and dose if known)  metoprolol succinate (TOPROL-XL) 25 MG 24 hr tablet  2. Which pharmacy/location (including street and city if local pharmacy) is medication to be sent to? [id number] - [place], [place] - [address]  3. Do they need a 30 day or 90 day supply?  30 day supply. Patient has an appointment scheduled for 2/20 with Coletta Memos, NP.

## 2021-03-21 NOTE — Telephone Encounter (Signed)
Refills sent to pharmacy. 

## 2021-03-21 NOTE — Addendum Note (Signed)
Addended by: Hedda Slade on: 03/21/2021 05:10 PM   Modules accepted: Orders

## 2021-04-13 NOTE — Progress Notes (Signed)
Cardiology Clinic Note   Patient Name: Traci Lewis Date of Encounter: 04/16/2021  Primary Care Provider:  Aletha Halim., PA-C Primary Cardiologist:  None  Patient Profile    Traci Lewis 56 year old female presents the clinic today for follow-up evaluation of her palpitations and hypertension.  Past Medical History    Past Medical History:  Diagnosis Date   Acid reflux    Breast discharge    left clear/yellowish spont   Thyroid condition circa 2013   discovered during pregnancy   History reviewed. No pertinent surgical history.  Allergies  No Known Allergies  History of Present Illness    Traci Lewis is PMH of HTN, palpitations, anxiety, and shortened PR interval.  She was initially referred by her PCP for evaluation of heart palpitations and elevated blood pressure.  She was seen 5/17 in follow-up and she was seen again by Dr. Claiborne Billings on 8/20.  During that time she noted increased heart rate.  Her episodes of increased heart rate were in the setting of elevated temperatures and with physical activity.  She noted 1 episode of increased heart rate that lasted approximately 1 hour.  She denied chest pain, presyncope and syncope.  She had been drinking half a cup to 1 cup of coffee per day.  Her echocardiogram 11/03/2018 showed an EF of 55 to 123456, normal diastolic parameters, and no significant valvular abnormalities.  Her cardiac event monitor 12/14/2018 showed sinus rhythm with an average heart rate of 72 bpm.  She was also noted to have sinus bradycardia 32 bpm while she was asleep, sinus tachycardia with the highest rate of 156 bpm.  No evidence of A-fib, SVT, or ectopic atrial tachycardia.  No pauses were noted.  She was seen in follow-up on 01/12/2019.  During that time she reported that she was trying to avoid caffeine.  Her echocardiogram and cardiac event monitor were reviewed.  Her lab work was also reviewed which was unremarkable.  She was encouraged to  increase her physical activity.  Some of her fast heart rate was felt to be due to aerobic deconditioning.  She denied presyncope and syncope.  Her beta-blocker was continued.  Follow-up was planned for 2-3 years and as needed.  She presents to the clinic today for follow-up evaluation states she has not had any further episodes of accelerated heart rate.  She reports that [place] felt like she was short of breath.  She took her time on the hike and did not have accelerated heart rate.  We reviewed her metoprolol and she has been taking 25 mg daily.  She also brought in a prescription for metoprolol tartrate.  I have asked her to dispose of her metoprolol tartrate because it is greater than 54 years old.  I have asked her to avoid triggers (caffeine, too much chocolate, alcohol, and dehydration etc.).  She has been walking some but is ready to increase her physical activity.  We discussed the importance of physical fitness and increasing her physical activity slowly.  I will have her follow-up in 2 years and as needed.  Today she denies chest pain, shortness of breath, lower extremity edema, fatigue, palpitations, melena, hematuria, hemoptysis, , presyncope, and syncope.   Home Medications    Prior to Admission medications   Medication Sig Start Date End Date Taking? Authorizing Provider  BLISOVI 24 FE 1-20 MG-MCG(24) tablet Take 1 tablet by mouth daily. 07/08/15   [provider]  citalopram (CELEXA) 10 MG tablet Take 10 mg by mouth daily. 01/13/14   [provider]  flurbiprofen (ANSAID) 100 MG tablet Take 100 mg by mouth 2 (two) times daily as needed.    [provider]  levothyroxine (SYNTHROID) 50 MCG tablet Take 50 mcg by mouth daily. 12/24/18   [provider]  LORazepam (ATIVAN) 0.5 MG tablet Take 1 tablet (0.5 mg total) by mouth daily as needed. 05/19/17   Troy Sine, MD  metoprolol succinate (TOPROL-XL) 25 MG 24 hr  tablet Take 1 tablet (25 mg total) by mouth daily. 03/21/21   Troy Sine, MD  SUMAtriptan (IMITREX) 100 MG tablet Take 100 mg by mouth every 2 (two) hours as needed for migraine. May repeat in 2 hours if headache persists or recurs.    [provider]    Family History    Family History  Problem Relation Age of Onset   Arrhythmia Mother    Breast cancer Sister    Heart failure Maternal Grandmother    Arrhythmia Maternal Grandfather    Breast cancer Maternal Aunt    She indicated that her mother is alive. She indicated that her father is alive. She indicated that both of her sisters are alive. She indicated that her maternal grandmother is deceased. She indicated that her maternal grandfather is deceased. She indicated that her paternal grandmother is deceased. She indicated that her paternal grandfather is deceased. She indicated that the status of her maternal aunt is unknown.  Social History    Social History   Socioeconomic History   Marital status: Married    Spouse name: Not on file   Number of children: Not on file   Years of education: Not on file   Highest education level: Not on file  Occupational History   Not on file  Tobacco Use   Smoking status: Never   Smokeless tobacco: Never  Substance and Sexual Activity   Alcohol use: Yes    Alcohol/week: 1.0 standard drink    Types: 1 Glasses of wine per week   Drug use: No   Sexual activity: Not on file  Other Topics Concern   Not on file  Social History Narrative   Not on file   Social Determinants of Health   Financial Resource Strain: Not on file  Food Insecurity: Not on file  Transportation Needs: Not on file  Physical Activity: Not on file  Stress: Not on file  Social Connections: Not on file  Intimate Partner Violence: Not on file     Review of Systems    General:  No chills, fever, night sweats or weight changes.  Cardiovascular:  No chest pain, dyspnea on exertion, edema, orthopnea,  palpitations, paroxysmal nocturnal dyspnea. Dermatological: No rash, lesions/masses Respiratory: No cough, dyspnea Urologic: No hematuria, dysuria Abdominal:   No nausea, vomiting, diarrhea, bright red blood per rectum, melena, or hematemesis Neurologic:  No visual changes, wkns, changes in mental status. All other systems reviewed and are otherwise negative except as noted above.  Physical Exam    VS:  BP 118/78    Pulse 62    Ht 5' 8.5" (1.74 m)    Wt 178 lb (80.7 kg)    SpO2 98%    BMI 26.67 kg/m  , BMI Body mass index is 26.67 kg/m. GEN: Well nourished, well developed, in no acute distress. HEENT: normal. Neck: Supple, no JVD, carotid bruits, or masses. Cardiac: RRR, no murmurs, rubs, or gallops. No  clubbing, cyanosis, edema.  Radials/DP/PT 2+ and equal bilaterally.  Respiratory:  Respirations regular and unlabored, clear to auscultation bilaterally. GI: Soft, nontender, nondistended, BS + x 4. MS: no deformity or atrophy. Skin: warm and dry, no rash. Neuro:  Strength and sensation are intact. Psych: Normal affect.  Accessory Clinical Findings    Recent Labs: No results found for requested labs within last 8760 hours.   Recent Lipid Panel    Component Value Date/Time   CHOL 191 10/21/2018 1028   TRIG 229 (H) 10/21/2018 1028   HDL 53 10/21/2018 1028   CHOLHDL 3.6 10/21/2018 1028   CHOLHDL 3.2 05/21/2016 0809   VLDL 30 05/21/2016 0809   LDLCALC 92 10/21/2018 1028    ECG personally reviewed by me today-normal sinus rhythm no ST or T wave deviation 62 bpm- No acute changes  Echocardiogram 11/03/2018  IMPRESSIONS     1. The left ventricle has normal systolic function, with an ejection  fraction of 55-60%. The cavity size was normal. Left ventricular diastolic  parameters were normal.   2. The right ventricle has normal systolic function. The cavity was  normal. There is no increase in right ventricular wall thickness.   3. The aortic valve is grossly normal. No  stenosis of the aortic valve.   4. The aorta is normal unless otherwise noted.   5. The ascending aorta and aortic root are normal in size and structure.   6. The atrial septum is grossly normal.  Cardiac event monitor 12/14/2018  Patient was monitored from September 9 through December 03, 2018.  The heart rhythm throughout the monitoring was sinus rhythm with an average heart rate at 72 bpm.  The slowest heart rate was sinus bradycardia at 32 bpm on 9/19 at 2:27 AM while the patient was sleeping.  Assess heart rate was sinus tachycardia at 156 bpm on October 8 at 10:20 AM while the patient was active.  No episodes of SVT, atrial fibrillation, or ectopic atrial  Tachycardia.  There was no significant ectopy.  There were no pauses or episodes of ventricular ectopy or VT.  Assessment & Plan   1.  Essential hypertension-BP today 118/78.  Well-controlled at home. Continue metoprolol Heart healthy low-sodium diet-salty 6 given Increase physical activity as tolerated-goal 150 minutes of moderate physical activity per week. Maintain blood pressure log  Palpitations-denies recent episodes of increased or irregular heartbeat.  Cardiac event monitor showed sinus rhythm, sinus tachycardia, sinus bradycardia and no significant arrhythmias.  Details above. Continue metoprolol succinate 25 mg daily Discontinue metoprolol tartrate Heart healthy low-sodium diet-salty 6 given Increase physical activity as tolerated Avoid triggers caffeine, chocolate, EtOH, dehydration etc.  Shortened PR interval-EKG today shows-.Normal sinus rhythm 62 bpm.  PR interval 136.3 ms Continue current medical therapy  Anxiety-well-controlled with Celexa, Ativan Follows with PCP  Disposition: Follow-up with Dr. Claiborne Billings or me in 2 yrs as needed  Jossie Ng. Debbora Ang NP-C    04/16/2021, 8:45 AM [address] Office [phone] Fax [phone]  Notice: This dictation was  prepared with Dragon dictation along with smaller phrase technology. Any transcriptional errors that result from this process are unintentional and may not be corrected upon review.  I spent 14 minutes examining this patient, reviewing medications, and using patient centered shared decision making involving her cardiac care.  Prior to her visit I spent greater than 20 minutes reviewing her past medical history,  medications, and prior cardiac tests.

## 2021-04-16 ENCOUNTER — Encounter: Payer: Self-pay | Admitting: General Practice

## 2021-04-16 ENCOUNTER — Ambulatory Visit: Payer: No Typology Code available for payment source | Admitting: General Practice

## 2021-04-16 ENCOUNTER — Other Ambulatory Visit: Payer: Self-pay

## 2021-04-16 VITALS — BP 118/78 | HR 62 | Ht 68.5 in | Wt 178.0 lb

## 2021-04-16 DIAGNOSIS — I10 Essential (primary) hypertension: Secondary | ICD-10-CM | POA: Diagnosis not present

## 2021-04-16 DIAGNOSIS — F419 Anxiety disorder, unspecified: Secondary | ICD-10-CM | POA: Diagnosis not present

## 2021-04-16 DIAGNOSIS — R002 Palpitations: Secondary | ICD-10-CM | POA: Diagnosis not present

## 2021-04-16 DIAGNOSIS — R9431 Abnormal electrocardiogram [ECG] [EKG]: Secondary | ICD-10-CM | POA: Diagnosis not present

## 2021-04-16 MED ORDER — METOPROLOL SUCCINATE ER 25 MG PO TB24: 25.0000 mg | ORAL_TABLET | Freq: Every day | ORAL | 3 refills | Status: DC

## 2021-04-16 NOTE — Patient Instructions (Signed)
Medication Instructions:  The current medical regimen is effective;  continue present plan and medications as directed. Please refer to the Current Medication list given to you today.  THROW AWAY THE EXPIRED METOPROLOL  *If you need a refill on your cardiac medications before your next appointment, please call your pharmacy*  Lab Work:   Testing/Procedures:  LABS FROM PCP  NONE  Special Instructions YOU MAY DRINK DECAF COFFEE-TRY TO LIMIT  Please try to avoid these triggers: Do not use any products that have nicotine or tobacco in them. These include cigarettes, e-cigarettes, and chewing tobacco. If you need help quitting, ask your doctor. Eat heart-healthy foods. Talk with your doctor about the right eating plan for you. Exercise regularly as told by your doctor. Stay hydrated Do not drink alcohol, Caffeine or chocolate. Lose weight if you are overweight. Do not use drugs, including cannabis   PLEASE INCREASE PHYSICAL ACTIVITY AS TOLERATED   Follow-Up: Your next appointment:  2 year(s) In Person with Olga Millers, MD   Please call our office 2 months in advance to schedule this appointment  :1  At [facility], you and your health needs are our priority.  As part of our continuing mission to provide you with exceptional heart care, we have created designated Provider Care Teams.  These Care Teams include your primary Cardiologist (physician) and Advanced Practice Providers (APPs -  Physician Assistants and Nurse Practitioners) who all work together to provide you with the care you need, when you need it.

## 2021-05-20 ENCOUNTER — Other Ambulatory Visit: Payer: Self-pay | Admitting: Cardiovascular Disease

## 2021-07-19 ENCOUNTER — Other Ambulatory Visit: Payer: Self-pay | Admitting: General Practice

## 2021-08-18 ENCOUNTER — Other Ambulatory Visit: Payer: Self-pay | Admitting: General Practice

## 2022-06-09 ENCOUNTER — Other Ambulatory Visit: Payer: Self-pay | Admitting: Cardiovascular Disease

## 2022-11-26 ENCOUNTER — Other Ambulatory Visit: Payer: Self-pay | Admitting: Cardiovascular Disease

## 2022-12-16 ENCOUNTER — Telehealth: Payer: Self-pay | Admitting: Cardiovascular Disease

## 2022-12-16 ENCOUNTER — Other Ambulatory Visit (HOSPITAL_COMMUNITY): Payer: Self-pay

## 2022-12-16 MED ORDER — METOPROLOL SUCCINATE ER 25 MG PO TB24
25.0000 mg | ORAL_TABLET | Freq: Every day | ORAL | 1 refills | Status: DC
  Filled 2022-12-16: qty 90, 90d supply, fill #0

## 2022-12-16 NOTE — Telephone Encounter (Signed)
*  STAT* If patient is at the pharmacy, call can be transferred to refill team.   1. Which medications need to be refilled? (please list name of each medication and dose if known)   metoprolol succinate (TOPROL-XL) 25 MG 24 hr tablet      4. Which pharmacy/location (including street and city if local pharmacy) is medication to be sent to? [id number] - [place], [place] - [address]     5. Do they need a 30 day or 90 day supply? 90

## 2022-12-16 NOTE — Telephone Encounter (Signed)
Pt's medication was sent to pt's pharmacy as requested. Confirmation received.  °

## 2023-01-15 ENCOUNTER — Telehealth: Payer: Self-pay | Admitting: Cardiovascular Disease

## 2023-01-15 ENCOUNTER — Other Ambulatory Visit: Payer: Self-pay | Admitting: Cardiovascular Disease

## 2023-01-15 NOTE — Telephone Encounter (Signed)
*  STAT* If patient is at the pharmacy, call can be transferred to refill team.   1. Which medications need to be refilled? (please list name of each medication and dose if known)   metoprolol succinate (TOPROL-XL) 25 MG 24 hr tablet    2. Which pharmacy/location (including street and city if local pharmacy) is medication to be sent to? [id number] - [place], [place] - [address] Phone: [phone]  Fax: [phone]      3. Do they need a 30 day or 90 day supply? 90 Pt has made appt for Feb. Looks like medication was sent to [facility] and she uses [facility].

## 2023-01-15 NOTE — Telephone Encounter (Signed)
Refill faxed to the patient's pharmacy.

## 2023-04-06 ENCOUNTER — Other Ambulatory Visit: Payer: Self-pay | Admitting: Cardiovascular Disease

## 2023-04-17 ENCOUNTER — Ambulatory Visit: Payer: 59 | Attending: Cardiovascular Disease | Admitting: Cardiovascular Disease

## 2023-04-17 ENCOUNTER — Encounter: Payer: Self-pay | Admitting: Cardiovascular Disease

## 2023-04-17 VITALS — BP 104/74 | HR 72 | Ht 69.0 in | Wt 179.0 lb

## 2023-04-17 DIAGNOSIS — F419 Anxiety disorder, unspecified: Secondary | ICD-10-CM

## 2023-04-17 DIAGNOSIS — I10 Essential (primary) hypertension: Secondary | ICD-10-CM | POA: Diagnosis not present

## 2023-04-17 DIAGNOSIS — R9431 Abnormal electrocardiogram [ECG] [EKG]: Secondary | ICD-10-CM

## 2023-04-17 DIAGNOSIS — E7800 Pure hypercholesterolemia, unspecified: Secondary | ICD-10-CM

## 2023-04-17 DIAGNOSIS — Z8669 Personal history of other diseases of the nervous system and sense organs: Secondary | ICD-10-CM

## 2023-04-17 DIAGNOSIS — R002 Palpitations: Secondary | ICD-10-CM | POA: Diagnosis not present

## 2023-04-17 NOTE — Progress Notes (Signed)
Patient ID: Traci Lewis, female   DOB: [birth year], 58 y.o.   MRN: [id number]        HPI : Traci Lewis is a 58 y.o. female who was initially referred through the courtesy of Dr. Marjory Lewis at [facility] practice in [place] for evaluation of heart palpitations and blood pressure elevation.  After not having seen her since May 2017 I saw her in August 2020 with complaints of increased heart rate and last saw her on January 12, 2019.  She presents for follow-up evaluation.  Traci Lewis denies any known coronary artery disease.  She has  mild hypothyroidism for which she has been on levothyroxine at 25 g.  She admits to not being very active.  Previously she had been drinking 2-3 cups of coffee in the morning.  She does exercise utilizing elliptical machine and recently, last year she began to notice that her heart rate was not slowing down following exertion.  She was evaluated on 01/03/2014, by her primary physician.  An ECGs revealed sinus tachycardia at approximately 103 bpm.  . She had had episodes of increased heart rate and palpitations.  She has been on citalopram 10 mg daily for anxiety and has taken Ativan as needed.  Laboratory from 01/03/2014:  Potassium was 4.8.  Renal function was normal with a BUN of 11 and creatinine 0.8.  Folate level was normal at 17.5.  TSH was normal at 2.47.  She was not anemic and her hemoglobin was 13.9 and hematocrit 43.0.    When I initially saw her, I recommended that she wear a 2 week event monitor.  This essentially showed sinus rhythm.  There were very rare episodes of isolated PACs.  Additional laboratory including a magnesium level was normal at 2.0.  She normal free T4 1 0.16 and free T3 at 2.4.  She was supposed undergo a 2-D echo Doppler study, but apparently inadvertently this had never been scheduled.  She had been doing fairly well but admitted to noticing several short-lived episodes of her heart rate speeding up.  I  07/17/2015.  She was sitting at dinner and her heart began to race.  Her pulse was proximally 123 times a minute.  After 45 minutes.  He never slowed down.  She did not take any metoprolol.  Finally, she presented to the emergency room.  Laboratory was drawn which showed a potassium of 4.0.  Creatinine was 1.04.  Glucose was 107.  She was not anemic.  Initial troponin was negative.  After waiting for approximate 5 hour.  She ultimately had to leave to take care of her children.  She was never formally evaluated by the physician.    When I saw her in May 2017 she complained of persistent tachycardia typically after she exercised was playing tennis.  At that time, I instituted Toprol-XL 25 mg to take on a daily basis and she still had metoprolol, tartrate to take on a when necessary basis.  This has helped.  However, she has continued to experience some of these episodes.  She admitted that she does not routinely do aerobic exercise.  She has a history of migraine headaches and has seen Dr. Mechele Collin Lewis at the headache, neck pain clinic.  She has a prescription for Imitrex which she takes on an as-needed basis.  She also is currently on levothyroxine 25 g.  She takes Celexa 10 mg daily.  She was recently given.  He told of all for headache relief  as an anti-inflammatory agent.    I saw her in follow-up in November 2017 she felt improved on metoprolol succinate which had been titrated up to 25 mg.  I saw her on October 21, 2018 and at that time she admitted to having 3 episodes this past summer during periods of significant heat when she was walking and noticed her heart rate speeding up.  On one episode her heart rate made elevated for approximately 1 hour.  She denied any chest pain, presyncope or syncope.  She was drinking 1/2 cup to 1 cup of caffeine per day.  Schedule her for an echo Doppler study, follow-up laboratory, and recommended a 30-day cardiac monitor for reassessment.  I last saw her on  January 12, 2019.  An echo Doppler study on November 03, 2018 showed an EF of 55 to 60% with normal diastolic function.  She had normal valves.  Her echo was normal.  Laboratory revealed a TSH of 3.0.  She had normal chemistry profile, magnesium was normal at 2.1.  She was not anemic with a hemoglobin of 13.8 and hematocrit 41.4.  Lipid studies revealed a total cholesterol 191 LDL cholesterol 92 triglycerides 229 and HDL 53.  She wore a 30-day event monitor which essentially showed sinus rhythm throughout the monitoring.  Her average heart rate was 72 bpm.  The slowest heart rate was 52 bpm which occurred while sleeping and the fastest heart rate was sinus tachycardia at 156 bpm which occurred during the waking hours at 1020 in the morning on December 03, 2018.  There was no ectopy.  She did not have any episodes of atrial fibrillation or SVT.  There were no pauses.  During that office visit, it was felt that some of her fast heart rate potentially may have been due to aerobic deconditioning leading to increased heart rate with less activity and persisting for some time following discontinuance of activity.  She was evaluated by Traci Lewis on April 16, 2021 and was remaining stable without recurrent episodes of accelerated heart rate.  He was advised to avoid triggers such as excess caffeine, excess chocolate, alcohol and dehydration.  Presently, [name] has felt well.  She walks every morning for approximately 1-1/2 miles walking her dogs.  She has 2 [place] shepherd's.  Denies any chest pain or shortness of breath.  She is unaware of any recent episodes of tachycardia.  She is no longer on Bovie or flurbiprofen.  She is on levothyroxine 50 mcg alternating with 75 mcg every other day.  She is on metoprolol succinate 25 mg daily.  She takes Imitrex as needed for migraine headaches.  She presents for evaluation.   Past Medical History:  Diagnosis Date   Acid reflux    Breast discharge    left  clear/yellowish spont   Thyroid condition circa 2013   discovered during pregnancy    No past surgical history on file.  No Known Allergies  Current Outpatient Medications  Medication Sig Dispense Refill   Cholecalciferol (VITAMIN D3) 1.25 MG (50000 UT) CAPS Take 1 capsule by mouth once a week.     citalopram (CELEXA) 10 MG tablet Take 10 mg by mouth daily.  11   levothyroxine (SYNTHROID) 50 MCG tablet Take 50 mcg by mouth daily.     levothyroxine (SYNTHROID) 75 MCG tablet Take by mouth.     LORazepam (ATIVAN) 0.5 MG tablet Take 1 tablet (0.5 mg total) by mouth daily as needed. 30 tablet 5   metoprolol  succinate (TOPROL-XL) 25 MG 24 hr tablet TAKE 1 TABLET(25 MG) BY MOUTH DAILY 90 tablet 0   SUMAtriptan (IMITREX) 100 MG tablet Take 100 mg by mouth every 2 (two) hours as needed for migraine. May repeat in 2 hours if headache persists or recurs.     No current facility-administered medications for this visit.    Additional social history is notable in that she is married for 23 years.  She has 3 children.  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  She never smoked.  She does drink occasional wine 1-2 days per week.  Family History  Problem Relation Age of Onset   Arrhythmia Mother    Breast cancer Sister    Heart failure Maternal Grandmother    Arrhythmia Maternal Grandfather    Breast cancer Maternal Aunt     ROS General: Negative; No fevers, chills, or night sweats HEENT: Negative; No changes in vision or hearing, sinus congestion, difficulty swallowing Pulmonary: Negative; No cough, wheezing, shortness of breath, hemoptysis Cardiovascular:  See HPI;  GI: Negative; No nausea, vomiting, diarrhea, or abdominal pain GU: Negative; No dysuria, hematuria, or difficulty voiding Musculoskeletal: Negative; no myalgias, joint pain, or weakness Hematologic/Oncologic: Negative; no easy bruising, bleeding Endocrine: History of mild thyroid abnormality,  currently on low-dose Synthroid.  No diabetes Neuro: Negative; no changes in balance, headaches Skin: Negative; No rashes or skin lesions Psychiatric: Mild anxiety; No behavioral problems, depression Sleep: Negative; No daytime sleepiness, hypersomnolence, bruxism, restless legs, hypnogagnic hallucinations Other comprehensive 14 point system review is negative   Physical Exam BP 104/74   Pulse 72   Ht 5\' 9"  (1.753 m)   Wt 179 lb (81.2 kg)   SpO2 98%   BMI 26.43 kg/m    Repeat blood pressure by me was 104/76  Wt Readings from Last 3 Encounters:  04/17/23 179 lb (81.2 kg)  04/16/21 178 lb (80.7 kg)  01/12/19 178 lb 9.6 oz (81 kg)   General: Alert, oriented, no distress.  Skin: normal turgor, no rashes, warm and dry HEENT: Normocephalic, atraumatic. Pupils equal round and reactive to light; sclera anicteric; extraocular muscles intact;  Nose without nasal septal hypertrophy Mouth/Parynx benign; Mallinpatti scale 2 Neck: No JVD, no carotid bruits; normal carotid upstroke Lungs: clear to ausculatation and percussion; no wheezing or rales Chest wall: without tenderness to palpitation Heart: PMI not displaced, RRR, s1 s2 normal, 1/6 systolic murmur, no diastolic murmur, no rubs, gallops, thrills, or heaves Abdomen: soft, nontender; no hepatosplenomehaly, BS+; abdominal aorta nontender and not dilated by palpation. Back: no CVA tenderness Pulses 2+ Musculoskeletal: full range of motion, normal strength, no joint deformities Extremities: no clubbing cyanosis or edema, Homan's sign negative  Neurologic: grossly nonfocal; Cranial nerves grossly wnl Psychologic: Normal mood and affect    EKG Interpretation Date/Time:  Thursday April 17 2023 10:22:54 EST Ventricular Rate:  72 PR Interval:  118 QRS Duration:  80 QT Interval:  354 QTC Calculation: 387 R Axis:   9  Text Interpretation: Normal sinus rhythm Normal ECG When compared with ECG of 17-Jul-2015 20:13, Questionable  change in QRS axis Nonspecific T wave abnormality now evident in Inferior leads T wave amplitude has decreased in Anterior leads Confirmed by Nicki Guadalajara ([id number]) on 04/17/2023 10:27:33 AM    January 12, 2019 ECG (independently read by me): Sinus rhythm; short PR at 98 msec without delta wave  November 2017 ECG (independently read by me): Sinus rhythm.  Short PR interval at 106 ms.  QTc interval normal.  May 2017 ECG (independently read by me): Normal sinus rhythm at 68 bpm.  There is accelerated AV conduction with a PR interval at 96 ms.  There are no delta waves.  ECG (independently read by me): Normal sinus rhythm at 72 bpm.  PR interval is now normal at 120 ms.  QRS 74 ms.  No ectopy.  Normal QTc interval  ECG (independently read by me): Normal sinus rhythm at 68 bpm.  The PR interval is slightly decreased at 102 ms, suggesting mild accelerated AV conduction; there are no delta waves are you against WPW  LABS:     Latest Ref Rng & Units 10/21/2018   10:28 AM 07/22/2017   10:48 AM 05/21/2016    8:09 AM  BMP  Glucose 65 - 99 mg/dL 79  77  83   BUN 6 - 24 mg/dL 15  13  17    Creatinine 0.57 - 1.00 mg/dL 1.19  1.47  8.29   BUN/Creat Ratio 9 - 23 16  16     Sodium 134 - 144 mmol/L 138  137  137   Potassium 3.5 - 5.2 mmol/L 4.8  4.9  4.5   Chloride 96 - 106 mmol/L 103  104  104   CO2 20 - 29 mmol/L 20  19  27    Calcium 8.7 - 10.2 mg/dL 9.2  9.3  8.8        Latest Ref Rng & Units 10/21/2018   10:28 AM 05/21/2016    8:09 AM  Hepatic Function  Total Protein 6.0 - 8.5 g/dL 6.7  6.8   Albumin 3.8 - 4.9 g/dL 4.3  3.8   AST 0 - 40 IU/L 12  13   ALT 0 - 32 IU/L 9  11   Alk Phosphatase 39 - 117 IU/L 28  26   Total Bilirubin 0.0 - 1.2 mg/dL 0.4  0.6        Latest Ref Rng & Units 10/21/2018   10:28 AM 07/22/2017   10:48 AM 05/21/2016    8:09 AM  CBC  WBC 3.4 - 10.8 x10E3/uL 7.1  6.6  5.9   Hemoglobin 11.1 - 15.9 g/dL 56.2  13.0  86.5   Hematocrit 34.0 - 46.6 % 41.4  39.3  40.0    Platelets 150 - 450 x10E3/uL 324  311  276     Lab Results  Component Value Date   MCV 92 10/21/2018   MCV 91 07/22/2017   MCV 91.7 05/21/2016    Lab Results  Component Value Date   TSH 3.000 10/21/2018     Lipid Panel     Component Value Date/Time   CHOL 191 10/21/2018 1028   TRIG 229 (H) 10/21/2018 1028   HDL 53 10/21/2018 1028   CHOLHDL 3.6 10/21/2018 1028   CHOLHDL 3.2 05/21/2016 0809   VLDL 30 05/21/2016 0809   LDLCALC 92 10/21/2018 1028    RADIOLOGY: No results found.  IMPRESSION: 1. Essential hypertension   2. Palpitations   3. Shortened PR interval   4. Elevated LDL cholesterol level   5. Anxiety   6. History of migraine headaches     ASSESSMENT AND PLAN: Traci Lewis is a very pleasant 58 year old female who had not routinely exercised and In the past and had noticed her heart rate staying elevated after she exercised which may have been contributed by reduced aerobic capacity.  Her ECGs have suggested accelerated AV conduction; there are no delta waves, arguing against  WPW.  She denies any definitive episodes of SVT or sustained tachycardia.  When I initially saw her, I gave her prescription for metoprolol tartrate to take 25-50 mg on an as-needed basis if she did experience an episode of tachycardia palpitation.  A prior CardioNet monitor only showed sinus rhythm with rare PACs.   Due to continued persistent elevated heart rates after exercise I started her on metoprolol succinate initially at 12.5 mg with titration up to 25 mg.  This has improved her symptoms.  In the past she due to her heart rate increase slight additional titration of Toprol-XL was recommended but due to fatigability apparently she had only been recently taking 25 mg daily.  When I saw her in August 2020 she had experienced 3 episodes where she felt her heart rate had increased significantly particularly during the heat and actually persisted for some time after discontinuing activity.   Her  echo Doppler study was  normal.  Her laboratory was normal with normal magnesium and thyroid function studies as well as electrolytes.  She was not anemic.Her most recent 30-day event monitor was essentially normal.  She was found to be in sinus rhythm through the entirety of the monitoring period with an average rate at 72 bpm.  The slowest heart rate achieved was 52 bpm while sleeping at 2:27 AM on September 19.  The fastest heart rate was sinus tachycardia during activity on October 8 at 10:20 AM.  There were no episodes of SVT, atrial fibrillation, or episodes of ventricular arrhythmia.  I suspect some of her fast heart rate potentially may be due to some aerobic deconditioning leading to increasing heart rate with less activity and persisting for some time following discontinuance of activity.  Prior evaluation I discussed benefit of cardiovascular aerobic training and reducing her resting heart rate as well as wanting her heart rate response with early exercise and its association with more rapid recovery of increased heart rate with discontinuance of exercise.  She has reduced caffeine and chocolate intake.  Her weight has been relatively stable at 179.  Presently she walks at a slow pace for approximately 1 and half miles on a daily basis walking her dogs.  She is now on levothyroxine 50 alternating with 75 mcg every other day.  She currently has been taking open all succinate 25 mg daily.  She continues to take citalopram for anxiety.  LDL cholesterol in August 2020 was 92.  She is now followed at [facility] and sees [place], [place].  Most recent lipid panel on January 29, 2023 showed total cholesterol 209 triglycerides 84 HDL 68 and LDL 123.  Discussed improved diet in particular the Mediterranean heart healthy diet.  I am checking an LP(a) today and if elevated I would recommend initiation of lipid-lowering therapy.  Otherwise, she will undergo repeat c-Met  and lipid panel in May/June and I will contact her regarding the results.  I discussed with her my plans for retirement at the end of June.  I will have her see Traci Lewis in 1 year for reevaluation and in 2 years I have suggested transition to the care of Dr. Epifanio Lesches for primary cardiology care.  Lennette Bihari, MD, [facility] 04/17/2023 5:21 PM

## 2023-04-17 NOTE — Patient Instructions (Addendum)
Medication Instructions:  No medication changes were made during today's visit. *If you need a refill on your cardiac medications before your next appointment, please call your pharmacy*   Lab Work: Labs will be drawn today If you have labs (blood work) drawn today and your tests are completely normal, you will receive your results only by: MyChart Message (if you have MyChart) OR A paper copy in the mail If you have any lab test that is abnormal or we need to change your treatment, we will call you to review the results.   Testing/Procedures: No procedures ordered today.    Follow-Up: At [facility], you and your health needs are our priority.  As part of our continuing mission to provide you with exceptional heart care, we have created designated Provider Care Teams.  These Care Teams include your primary Cardiologist (physician) and Advanced Practice Providers (APPs -  Physician Assistants and Nurse Practitioners) who all work together to provide you with the care you need, when you need it.  We recommend signing up for the patient portal called "MyChart".  Sign up information is provided on this After Visit Summary.  MyChart is used to connect with patients for Virtual Visits (Telemedicine).  Patients are able to view lab/test results, encounter notes, upcoming appointments, etc.  Non-urgent messages can be sent to your provider as well.   To learn more about what you can do with MyChart, go to [email or web address].    Your next appointment:   1 year(s)   Provider:   Edd Fabian, FNP        Other Instructions        1st Floor: - Lobby - Registration  - Pharmacy  - Lab - Cafe   2nd Floor: - PV Lab - Diagnostic Testing (echo, CT, nuclear med)   3rd Floor: - Vacant   4th Floor: - TCTS (cardiothoracic surgery) - AFib Clinic - Structural Heart Clinic - Vascular Surgery  - Vascular Ultrasound   5th Floor: - HeartCare Cardiology (general and EP) -  Clinical Pharmacy for coumadin, hypertension, lipid, weight-loss medications, and med management appointments      Valet parking services will be available as well.

## 2023-04-20 LAB — LIPOPROTEIN A (LPA): Lipoprotein (a): 23.4 nmol/L (ref <75.0)

## 2023-07-07 ENCOUNTER — Other Ambulatory Visit: Payer: Self-pay | Admitting: *Deleted

## 2023-07-07 MED ORDER — METOPROLOL SUCCINATE ER 25 MG PO TB24: 25.0000 mg | ORAL_TABLET | Freq: Every day | ORAL | 3 refills | Status: AC

## 2024-06-24 ENCOUNTER — Ambulatory Visit: Admitting: Cardiology
